# Patient Record
Sex: Male | Born: 1965 | Race: White | Hispanic: No | Marital: Married | Smoking: Never smoker
Health system: Southern US, Community
[De-identification: ages and names within clinical notes are randomized; demographics above are authoritative.]

## PROBLEM LIST (undated history)

## (undated) DIAGNOSIS — J302 Other seasonal allergic rhinitis: Secondary | ICD-10-CM

## (undated) DIAGNOSIS — N200 Calculus of kidney: Secondary | ICD-10-CM

## (undated) DIAGNOSIS — B019 Varicella without complication: Secondary | ICD-10-CM

## (undated) DIAGNOSIS — E7800 Pure hypercholesterolemia, unspecified: Secondary | ICD-10-CM

## (undated) DIAGNOSIS — N390 Urinary tract infection, site not specified: Secondary | ICD-10-CM

## (undated) HISTORY — DX: Pure hypercholesterolemia, unspecified: E78.00

## (undated) HISTORY — DX: Calculus of kidney: N20.0

## (undated) HISTORY — DX: Varicella without complication: B01.9

## (undated) HISTORY — DX: Other seasonal allergic rhinitis: J30.2

## (undated) HISTORY — DX: Urinary tract infection, site not specified: N39.0

---

## 2015-02-25 ENCOUNTER — Ambulatory Visit: Payer: Self-pay | Admitting: Adult Health

## 2015-02-28 ENCOUNTER — Ambulatory Visit (INDEPENDENT_AMBULATORY_CARE_PROVIDER_SITE_OTHER): Payer: BLUE CROSS/BLUE SHIELD | Admitting: Adult Health

## 2015-02-28 ENCOUNTER — Encounter: Payer: Self-pay | Admitting: Adult Health

## 2015-02-28 VITALS — BP 110/72 | Temp 98.3°F | Ht 74.0 in | Wt 234.5 lb

## 2015-02-28 DIAGNOSIS — Z7689 Persons encountering health services in other specified circumstances: Secondary | ICD-10-CM

## 2015-02-28 DIAGNOSIS — Z7189 Other specified counseling: Secondary | ICD-10-CM | POA: Diagnosis not present

## 2015-02-28 DIAGNOSIS — Z23 Encounter for immunization: Secondary | ICD-10-CM | POA: Diagnosis not present

## 2015-02-28 DIAGNOSIS — J209 Acute bronchitis, unspecified: Secondary | ICD-10-CM

## 2015-02-28 MED ORDER — BENZONATATE 200 MG PO CAPS: 200.0000 mg | ORAL_CAPSULE | Freq: Three times a day (TID) | ORAL | Status: DC | PRN

## 2015-02-28 MED ORDER — DOXYCYCLINE HYCLATE 100 MG PO CAPS: 100.0000 mg | ORAL_CAPSULE | Freq: Two times a day (BID) | ORAL | Status: DC

## 2015-02-28 NOTE — Patient Instructions (Addendum)
Follow up with me when it is convenient for you for your complete physical.   Start exercising and continue to eat a healthy diet.   Health Maintenance A healthy lifestyle and preventative care can promote health and wellness.  Maintain regular health, dental, and eye exams.  Eat a healthy diet. Foods like vegetables, fruits, whole grains, low-fat dairy products, and lean protein foods contain the nutrients you need and are low in calories. Decrease your intake of foods high in solid fats, added sugars, and salt. Get information about a proper diet from your health care provider, if necessary.  Regular physical exercise is one of the most important things you can do for your health. Most adults should get at least 150 minutes of moderate-intensity exercise (any activity that increases your heart rate and causes you to sweat) each week. In addition, most adults need muscle-strengthening exercises on 2 or more days a week.   Maintain a healthy weight. The body mass index (BMI) is a screening tool to identify possible weight problems. It provides an estimate of body fat based on height and weight. Your health care provider can find your BMI and can help you achieve or maintain a healthy weight. For males 20 years and older:  A BMI below 18.5 is considered underweight.  A BMI of 18.5 to 24.9 is normal.  A BMI of 25 to 29.9 is considered overweight.  A BMI of 30 and above is considered obese.  Maintain normal blood lipids and cholesterol by exercising and minimizing your intake of saturated fat. Eat a balanced diet with plenty of fruits and vegetables. Blood tests for lipids and cholesterol should begin at age 66 and be repeated every 5 years. If your lipid or cholesterol levels are high, you are over age 24, or you are at high risk for heart disease, you may need your cholesterol levels checked more frequently.Ongoing high lipid and cholesterol levels should be treated with medicines if diet and  exercise are not working.  If you smoke, find out from your health care provider how to quit. If you do not use tobacco, do not start.  Lung cancer screening is recommended for adults aged 55-80 years who are at high risk for developing lung cancer because of a history of smoking. A yearly low-dose CT scan of the lungs is recommended for people who have at least a 30-pack-year history of smoking and are current smokers or have quit within the past 15 years. A pack year of smoking is smoking an average of 1 pack of cigarettes a day for 1 year (for example, a 30-pack-year history of smoking could mean smoking 1 pack a day for 30 years or 2 packs a day for 15 years). Yearly screening should continue until the smoker has stopped smoking for at least 15 years. Yearly screening should be stopped for people who develop a health problem that would prevent them from having lung cancer treatment.  If you choose to drink alcohol, do not have more than 2 drinks per day. One drink is considered to be 12 oz (360 mL) of beer, 5 oz (150 mL) of wine, or 1.5 oz (45 mL) of liquor.  Avoid the use of street drugs. Do not share needles with anyone. Ask for help if you need support or instructions about stopping the use of drugs.  High blood pressure causes heart disease and increases the risk of stroke. Blood pressure should be checked at least every 1-2 years. Ongoing high blood  pressure should be treated with medicines if weight loss and exercise are not effective.  If you are 57-60 years old, ask your health care provider if you should take aspirin to prevent heart disease.  Diabetes screening involves taking a blood sample to check your fasting blood sugar level. This should be done once every 3 years after age 33 if you are at a normal weight and without risk factors for diabetes. Testing should be considered at a younger age or be carried out more frequently if you are overweight and have at least 1 risk factor for  diabetes.  Colorectal cancer can be detected and often prevented. Most routine colorectal cancer screening begins at the age of 69 and continues through age 58. However, your health care provider may recommend screening at an earlier age if you have risk factors for colon cancer. On a yearly basis, your health care provider may provide home test kits to check for hidden blood in the stool. A small camera at the end of a tube may be used to directly examine the colon (sigmoidoscopy or colonoscopy) to detect the earliest forms of colorectal cancer. Talk to your health care provider about this at age 29 when routine screening begins. A direct exam of the colon should be repeated every 5-10 years through age 94, unless early forms of precancerous polyps or small growths are found.  People who are at an increased risk for hepatitis B should be screened for this virus. You are considered at high risk for hepatitis B if:  You were born in a country where hepatitis B occurs often. Talk with your health care provider about which countries are considered high risk.  Your parents were born in a high-risk country and you have not received a shot to protect against hepatitis B (hepatitis B vaccine).  You have HIV or AIDS.  You use needles to inject street drugs.  You live with, or have sex with, someone who has hepatitis B.  You are a man who has sex with other men (MSM).  You get hemodialysis treatment.  You take certain medicines for conditions like cancer, organ transplantation, and autoimmune conditions.  Hepatitis C blood testing is recommended for all people born from [birth year] through 1964 and any individual with known risk factors for hepatitis C.  Healthy men should no longer receive prostate-specific antigen (PSA) blood tests as part of routine cancer screening. Talk to your health care provider about prostate cancer screening.  Testicular cancer screening is not recommended for adolescents or  adult males who have no symptoms. Screening includes self-exam, a health care provider exam, and other screening tests. Consult with your health care provider about any symptoms you have or any concerns you have about testicular cancer.  Practice safe sex. Use condoms and avoid high-risk sexual practices to reduce the spread of sexually transmitted infections (STIs).  You should be screened for STIs, including gonorrhea and chlamydia if:  You are sexually active and are younger than 24 years.  You are older than 24 years, and your health care provider tells you that you are at risk for this type of infection.  Your sexual activity has changed since you were last screened, and you are at an increased risk for chlamydia or gonorrhea. Ask your health care provider if you are at risk.  If you are at risk of being infected with HIV, it is recommended that you take a prescription medicine daily to prevent HIV infection. This is called pre-exposure  prophylaxis (PrEP). You are considered at risk if:  You are a man who has sex with other men (MSM).  You are a heterosexual man who is sexually active with multiple partners.  You take drugs by injection.  You are sexually active with a partner who has HIV.  Talk with your health care provider about whether you are at high risk of being infected with HIV. If you choose to begin PrEP, you should first be tested for HIV. You should then be tested every 3 months for as long as you are taking PrEP.  Use sunscreen. Apply sunscreen liberally and repeatedly throughout the day. You should seek shade when your shadow is shorter than you. Protect yourself by wearing long sleeves, pants, a wide-brimmed hat, and sunglasses year round whenever you are outdoors.  Tell your health care provider of new moles or changes in moles, especially if there is a change in shape or color. Also, tell your health care provider if a mole is larger than the size of a pencil  eraser.  A one-time screening for abdominal aortic aneurysm (AAA) and surgical repair of large AAAs by ultrasound is recommended for men aged 1-75 years who are current or former smokers.  Stay current with your vaccines (immunizations). Document Released: 02/27/2008 Document Revised: 09/05/2013 Document Reviewed: 01/26/2011 [facility] Patient Information 2015 [place], [place]. This information is not intended to replace advice given to you by your health care provider. Make sure you discuss any questions you have with your health care provider.

## 2015-02-28 NOTE — Progress Notes (Signed)
HPI:  Bryan Mccall is here to establish care.  Last PCP and physical: June 2015.   Has the following chronic problems that require follow up and concerns today:  Acute Lower back pain on occasion, usually in the morning, prior to getting out of bed. He also complains of heel pain that is only present when his shoes are off.     Upper respiratory infection  For the last four weeks he has had a productive cough in the morning with yellow mucus. The rest of the day the cough is dry, also endorses nasal drip.     ROS negative for unless reported above: fevers, chills,feeling poorly, unintentional weight loss, hearing or vision loss, chest pain, palpitations, leg claudication, struggling to breath,Not feeling congested in the chest, no orthopenia, no cough,no wheezing, normal appetite, no soft tissue swelling, no hemoptysis, melena, hematochezia, hematuria, falls, loc, si, or thoughts of self harm.  Immunizations:UTD Diet: Eats vegetables, does not eat a lot of carbs. Eats both red and lean meats.  Exercise:Does not exercise, but does work in yard.    Past Medical History  Diagnosis Date  . Chicken pox   . Seasonal allergies   . High cholesterol   . Kidney stone   . UTI (urinary tract infection)     History reviewed. No pertinent past surgical history.  Family History  Problem Relation Age of Onset  . Arthritis Mother   . Arthritis Maternal Grandmother   . Elevated Lipids      both sides of family  . Hypertension Mother   . Hypertension Father   . Heart disease Father   . Emphysema Father   . Diabetes Father   . Hypertension Paternal Grandfather   . Heart disease Paternal Grandfather   . Hypertension Brother     History   Social History  . Marital Status: Married    Spouse Name: N/A  . Number of Children: N/A  . Years of Education: N/A   Social History Main Topics  . Smoking status: Never Smoker   . Smokeless tobacco: Not on file  . Alcohol Use: 0.0 oz/week     0 Standard drinks or equivalent per week     Comment: per pt 1 beer 3 to 4 nights a week with dinner   . Drug Use: No  . Sexual Activity: Not on file   Other Topics Concern  . None   Social History Narrative  . None     Current outpatient prescriptions:  .  ibuprofen (ADVIL,MOTRIN) 800 MG tablet, Take 800 mg by mouth every 8 (eight) hours as needed for moderate pain., Disp: , Rfl:  .  sildenafil (VIAGRA) 25 MG tablet, Take 25 mg by mouth daily as needed for erectile dysfunction., Disp: , Rfl:  .  simvastatin (ZOCOR) 20 MG tablet, Take 20 mg by mouth daily., Disp: , Rfl:   EXAM:  Filed Vitals:   02/28/15 1354  BP: 110/72  Temp: 98.3 F (36.8 C)    Body mass index is 30.1 kg/(m^2).  GENERAL: vitals reviewed and listed above, alert, oriented, appears well hydrated and in no acute distress. Slightly obese   HEENT: atraumatic, conjunttiva clear, no obvious abnormalities on inspection of external nose and ears. TM's visualized  NECK: Neck is soft and supple without masses, no adenopathy or thyromegaly, trachea midline, no JVD. Normal range of motion.   LUNGS: clear to auscultation bilaterally, no wheezes, rales or rhonchi, good air movement  CV: Regular rate and rhythm,  normal S1/S2, no audible murmurs, gallops, or rubs. No carotid bruit and no peripheral edema.   MS: moves all extremities without noticeable abnormality. No edema noted  Abd: soft/nontender/nondistended/normal bowel sounds   Skin: warm and dry, no rash   Extremities: No clubbing, cyanosis, or edema. Capillary refill is WNL. Pulses intact bilaterally in upper and lower extremities.   Neuro: CN II-XII intact, sensation and reflexes normal throughout, 5/5 muscle strength in bilateral upper and lower extremities. Normal finger to nose. Normal rapid alternating movements.  PSYCH: pleasant and cooperative, no obvious depression or anxiety  ASSESSMENT AND PLAN:  Discussed the following assessment and  plan:  1. Encounter to establish care - Follow up in one month for CPE -  Follow up sooner if needed.  Continue to work on diet and exercise. He is 234 lbs and his ideal body weight is 220.  - Back pain is likely due to being overweight.   2. Acute bronchitis, unspecified organism - doxycycline (VIBRAMYCIN) 100 MG capsule; Take 1 capsule (100 mg total) by mouth 2 (two) times daily.  Dispense: 20 capsule; Refill: 0 - benzonatate (TESSALON) 200 MG capsule; Take 1 capsule (200 mg total) by mouth 3 (three) times daily as needed for cough.  Dispense: 20 capsule; Refill: 0 - Follow up if no improvement in 2-3 days.  3. Need for Tdap vaccination  - Tdap vaccine greater than or equal to 7yo IM   No diagnosis found. -We reviewed the PMH, PSH, FH, SH, Meds and Allergies. -We provided refills for any medications we will prescribe as needed. -We addressed current concerns per orders and patient instructions. -We have asked for records for pertinent exams, studies, vaccines and notes from previous providers. -We have advised patient to follow up per instructions below.   -Patient advised to return or notify a provider immediately if symptoms worsen or persist or new concerns arise.  There are no Patient Instructions on file for this visit.   [employer]

## 2015-04-08 ENCOUNTER — Other Ambulatory Visit (INDEPENDENT_AMBULATORY_CARE_PROVIDER_SITE_OTHER): Payer: BLUE CROSS/BLUE SHIELD

## 2015-04-08 DIAGNOSIS — Z0000 Encounter for general adult medical examination without abnormal findings: Secondary | ICD-10-CM

## 2015-04-08 LAB — POCT URINALYSIS DIPSTICK
BILIRUBIN UA: NEGATIVE
Blood, UA: NEGATIVE
GLUCOSE UA: NEGATIVE
Ketones, UA: NEGATIVE
Leukocytes, UA: NEGATIVE
NITRITE UA: NEGATIVE
PROTEIN UA: NEGATIVE
SPEC GRAV UA: 1.025
UROBILINOGEN UA: 0.2
pH, UA: 5.5

## 2015-04-08 LAB — CBC WITH DIFFERENTIAL/PLATELET
BASOS ABS: 0 10*3/uL (ref 0.0–0.1)
Basophils Relative: 0.3 % (ref 0.0–3.0)
Eosinophils Absolute: 0.1 10*3/uL (ref 0.0–0.7)
Eosinophils Relative: 1.4 % (ref 0.0–5.0)
HCT: 45.3 % (ref 39.0–52.0)
Hemoglobin: 15.2 g/dL (ref 13.0–17.0)
LYMPHS PCT: 21.4 % (ref 12.0–46.0)
Lymphs Abs: 1.5 10*3/uL (ref 0.7–4.0)
MCHC: 33.6 g/dL (ref 30.0–36.0)
MCV: 90.4 fl (ref 78.0–100.0)
Monocytes Absolute: 0.4 10*3/uL (ref 0.1–1.0)
Monocytes Relative: 6.1 % (ref 3.0–12.0)
NEUTROS ABS: 4.8 10*3/uL (ref 1.4–7.7)
Neutrophils Relative %: 70.8 % (ref 43.0–77.0)
PLATELETS: 161 10*3/uL (ref 150.0–400.0)
RBC: 5.01 Mil/uL (ref 4.22–5.81)
RDW: 13.5 % (ref 11.5–15.5)
WBC: 6.8 10*3/uL (ref 4.0–10.5)

## 2015-04-08 LAB — LIPID PANEL
CHOL/HDL RATIO: 5
Cholesterol: 196 mg/dL (ref 0–200)
HDL: 43.1 mg/dL (ref >39.00)
LDL Cholesterol: 120 mg/dL — ABNORMAL HIGH (ref 0–99)
NONHDL: 152.9
TRIGLYCERIDES: 164 mg/dL — AB (ref 0.0–149.0)
VLDL: 32.8 mg/dL (ref 0.0–40.0)

## 2015-04-08 LAB — HEPATIC FUNCTION PANEL
ALT: 20 U/L (ref 0–53)
AST: 13 U/L (ref 0–37)
Albumin: 4.5 g/dL (ref 3.5–5.2)
Alkaline Phosphatase: 70 U/L (ref 39–117)
BILIRUBIN TOTAL: 0.5 mg/dL (ref 0.2–1.2)
Bilirubin, Direct: 0.1 mg/dL (ref 0.0–0.3)
TOTAL PROTEIN: 6.8 g/dL (ref 6.0–8.3)

## 2015-04-08 LAB — BASIC METABOLIC PANEL
BUN: 16 mg/dL (ref 6–23)
CHLORIDE: 103 meq/L (ref 96–112)
CO2: 27 meq/L (ref 19–32)
CREATININE: 1.02 mg/dL (ref 0.40–1.50)
Calcium: 9.4 mg/dL (ref 8.4–10.5)
GFR: 82.95 mL/min (ref >60.00)
GLUCOSE: 110 mg/dL — AB (ref 70–99)
Potassium: 4 mEq/L (ref 3.5–5.1)
Sodium: 139 mEq/L (ref 135–145)

## 2015-04-08 LAB — TSH: TSH: 1.35 u[IU]/mL (ref 0.35–4.50)

## 2015-04-15 ENCOUNTER — Encounter: Payer: Self-pay | Admitting: Adult Health

## 2015-04-15 ENCOUNTER — Ambulatory Visit (INDEPENDENT_AMBULATORY_CARE_PROVIDER_SITE_OTHER): Payer: BLUE CROSS/BLUE SHIELD | Admitting: Adult Health

## 2015-04-15 ENCOUNTER — Telehealth: Payer: Self-pay | Admitting: Adult Health

## 2015-04-15 VITALS — BP 120/70 | Temp 99.2°F | Ht 73.0 in | Wt 236.0 lb

## 2015-04-15 DIAGNOSIS — R05 Cough: Secondary | ICD-10-CM

## 2015-04-15 DIAGNOSIS — R059 Cough, unspecified: Secondary | ICD-10-CM

## 2015-04-15 DIAGNOSIS — E785 Hyperlipidemia, unspecified: Secondary | ICD-10-CM | POA: Diagnosis not present

## 2015-04-15 DIAGNOSIS — Z0000 Encounter for general adult medical examination without abnormal findings: Secondary | ICD-10-CM | POA: Diagnosis not present

## 2015-04-15 DIAGNOSIS — R7309 Other abnormal glucose: Secondary | ICD-10-CM

## 2015-04-15 DIAGNOSIS — R739 Hyperglycemia, unspecified: Secondary | ICD-10-CM

## 2015-04-15 LAB — HEMOGLOBIN A1C: HEMOGLOBIN A1C: 6.2 % (ref 4.6–6.5)

## 2015-04-15 MED ORDER — ALBUTEROL SULFATE HFA 108 (90 BASE) MCG/ACT IN AERS: 2.0000 | INHALATION_SPRAY | Freq: Four times a day (QID) | RESPIRATORY_TRACT | Status: DC | PRN

## 2015-04-15 NOTE — Addendum Note (Signed)
Addended by: Azucena Freed on: 04/15/2015 04:27 PM   Modules accepted: Orders

## 2015-04-15 NOTE — Patient Instructions (Signed)
It was great seeing you again!   I will follow up with you regarding your blood work.   Use the Albuteral Inhaler ( 2 puffs) every six hours as needed. You can try Delsym or Mucinex for your cough.   Continue to work on diet and exercise.

## 2015-04-15 NOTE — Progress Notes (Signed)
Pre visit review using our clinic review tool, if applicable. No additional management support is needed unless otherwise documented below in the visit note. 

## 2015-04-15 NOTE — Progress Notes (Addendum)
Subjective:    Patient ID: Bryan Mccall, male    DOB: Apr 17, 1966, 49 y.o.   MRN: [id number]  HPI  49 year old male who presents to the office for his complete physical exam. I last saw him on 02/28/2015 for his establish care visit and for upper respiratory infection. He  has a past medical history of Chicken pox; Seasonal allergies; High cholesterol; Kidney stone; and UTI (urinary tract infection).   He is currently taking  Simvastatin for high cholesterol and tolerates this well.   His blood pressure is well controlled.   His weight it up 2 pounds since his last visit.   He continues to have a dry cough. This is not affecting his sleep and does not have any chest pain or SOB. He endorses that it is getting better on a daily basis.   He does his periodic health maintenance exams such as preventative eye and dental exams.   Has no complaints today.   Review of Systems  Constitutional: Negative.   HENT: Negative.   Eyes: Negative.   Respiratory: Positive for cough. Negative for chest tightness, shortness of breath and wheezing.   Cardiovascular: Negative.   Gastrointestinal: Negative.   Endocrine: Negative.   Genitourinary: Negative.   Musculoskeletal: Negative.   Skin: Negative.   Neurological: Negative.   Hematological: Negative.   Psychiatric/Behavioral: Negative.    Past Medical History  Diagnosis Date  . Chicken pox   . Seasonal allergies   . High cholesterol   . Kidney stone   . UTI (urinary tract infection)     History   Social History  . Marital Status: Married    Spouse Name: N/A  . Number of Children: N/A  . Years of Education: N/A   Occupational History  . Not on file.   Social History Main Topics  . Smoking status: Never Smoker   . Smokeless tobacco: Not on file  . Alcohol Use: 0.0 oz/week    0 Standard drinks or equivalent per week     Comment: per pt 1 beer 3 to 4 nights a week with dinner   . Drug Use: No  . Sexual Activity: Not  on file   Other Topics Concern  . Not on file   Social History Orthoptist for [employer].    Married   49 year old ( [place]) 12 boys       No past surgical history on file.  Family History  Problem Relation Age of Onset  . Arthritis Mother   . Arthritis Maternal Grandmother   . Elevated Lipids      both sides of family  . Hypertension Mother   . Hypertension Father   . Heart disease Father   . Emphysema Father   . Diabetes Father   . Hypertension Paternal Grandfather   . Heart disease Paternal Grandfather   . Hypertension Brother     No Known Allergies  Current Outpatient Prescriptions on File Prior to Visit  Medication Sig Dispense Refill  . ibuprofen (ADVIL,MOTRIN) 800 MG tablet Take 800 mg by mouth every 8 (eight) hours as needed for moderate pain.    . sildenafil (VIAGRA) 25 MG tablet Take 25 mg by mouth daily as needed for erectile dysfunction.     No current facility-administered medications on file prior to visit.    BP 120/70 mmHg  Temp(Src) 99.2 F (37.3 C) (Oral)  Ht  (1.854 m)  Wt 236 lb (107.049 kg)  BMI 31.14 kg/m2       Objective:   Physical Exam  Constitutional: He is oriented to person, place, and time. He appears well-developed and well-nourished. No distress.  HENT:  Head: Normocephalic and atraumatic.  Right Ear: External ear normal.  Left Ear: External ear normal.  Nose: Nose normal.  Mouth/Throat: Oropharynx is clear and moist.  Eyes: Conjunctivae and EOM are normal. Pupils are equal, round, and reactive to light. Right eye exhibits no discharge. Left eye exhibits no discharge. No scleral icterus.  Neck: Normal range of motion. Neck supple. No JVD present. No tracheal deviation present. No thyromegaly present.  Cardiovascular: Normal rate, regular rhythm, normal heart sounds and intact distal pulses.  Exam reveals no gallop and no friction rub.   No murmur heard. Pulmonary/Chest: Effort normal and breath  sounds normal. No respiratory distress. He has no wheezes. He has no rales. He exhibits no tenderness.  Abdominal: Soft. Bowel sounds are normal. He exhibits no distension and no mass. There is no tenderness. There is no rebound and no guarding.  Slightly obese around abdomen  Genitourinary:  Deferred  Musculoskeletal: Normal range of motion. He exhibits no edema or tenderness.  Lymphadenopathy:    He has no cervical adenopathy.  Neurological: He is alert and oriented to person, place, and time. He has normal reflexes. No cranial nerve deficit. Coordination normal.  Skin: Skin is warm and dry. No rash noted. He is not diaphoretic. No erythema. No pallor.  Psychiatric: He has a normal mood and affect. His behavior is normal. Judgment and thought content normal.  Nursing note and vitals reviewed.      Assessment & Plan:  1. Routine general medical examination at a health care facility - Hemoglobin A1c - EKG 12-Lead, NSR, rate 73 - Weight has increased two pounds. Stressed importance of diet and exercise.   2. Elevated blood sugar - Hemoglobin A1c  3. Cough - Likely bronchitis.  - albuterol (PROVENTIL HFA;VENTOLIN HFA) 108 (90 BASE) MCG/ACT inhaler; Inhale 2 puffs into the lungs every 6 (six) hours as needed for wheezing or shortness of breath.  Dispense: 1 Inhaler; Refill: 2 - Follow up if no improvement in one week.  - Can use OTC mucinex or delsym as well.   4. Hyperlipidemia - Stressed importance of diet and exercise. He is already on a statin medication. Consider Zetia in the future.  - Hemoglobin A1c - EKG 12-Lead

## 2015-04-15 NOTE — Telephone Encounter (Signed)
Spoke to patient regarding his A1c result.

## 2015-04-15 NOTE — Addendum Note (Signed)
Addended by: Nancy Fetter on: 04/15/2015 02:54 PM   Modules accepted: Kipp Brood

## 2015-04-22 ENCOUNTER — Telehealth: Payer: Self-pay | Admitting: Adult Health

## 2015-04-22 MED ORDER — SIMVASTATIN 40 MG PO TABS: 40.0000 mg | ORAL_TABLET | Freq: Every day | ORAL | Status: DC

## 2015-04-22 NOTE — Telephone Encounter (Signed)
Rx sent to mail order electronically. Pt states he will call back if he needs a 7 day supply sent to a local pharmacy.

## 2015-04-22 NOTE — Telephone Encounter (Signed)
Pt request refill simvastatin (ZOCOR) 40 MG tablet Pt thought this was called in at his visit. Now has only 7 tabs left [facility] /90 day supply

## 2015-05-21 ENCOUNTER — Encounter: Payer: Self-pay | Admitting: Adult Health

## 2015-05-21 ENCOUNTER — Other Ambulatory Visit: Payer: Self-pay | Admitting: Adult Health

## 2015-05-21 ENCOUNTER — Telehealth: Payer: Self-pay | Admitting: Adult Health

## 2015-05-21 ENCOUNTER — Ambulatory Visit (INDEPENDENT_AMBULATORY_CARE_PROVIDER_SITE_OTHER)
Admission: RE | Admit: 2015-05-21 | Discharge: 2015-05-21 | Disposition: A | Discharge status: Home / Self Care | Payer: BLUE CROSS/BLUE SHIELD | Source: Ambulatory Visit | Attending: Adult Health | Admitting: Adult Health

## 2015-05-21 ENCOUNTER — Ambulatory Visit (INDEPENDENT_AMBULATORY_CARE_PROVIDER_SITE_OTHER): Payer: BLUE CROSS/BLUE SHIELD | Admitting: Adult Health

## 2015-05-21 VITALS — BP 98/70 | HR 99 | Temp 103.0°F | Wt 231.4 lb

## 2015-05-21 DIAGNOSIS — R059 Cough, unspecified: Secondary | ICD-10-CM

## 2015-05-21 DIAGNOSIS — J0100 Acute maxillary sinusitis, unspecified: Secondary | ICD-10-CM | POA: Diagnosis not present

## 2015-05-21 DIAGNOSIS — R509 Fever, unspecified: Secondary | ICD-10-CM | POA: Diagnosis not present

## 2015-05-21 DIAGNOSIS — R05 Cough: Secondary | ICD-10-CM | POA: Diagnosis not present

## 2015-05-21 LAB — POCT RAPID STREP A (OFFICE): RAPID STREP A SCREEN: NEGATIVE

## 2015-05-21 MED ORDER — AMOXICILLIN-POT CLAVULANATE 875-125 MG PO TABS: 1.0000 | ORAL_TABLET | Freq: Two times a day (BID) | ORAL | Status: DC

## 2015-05-21 MED ORDER — HYDROCODONE-HOMATROPINE 5-1.5 MG/5ML PO SYRP
5.0000 mL | ORAL_SOLUTION | Freq: Three times a day (TID) | ORAL | Status: DC | PRN
Start: 2015-05-21 — End: 2015-07-23

## 2015-05-21 NOTE — Progress Notes (Signed)
Subjective:    Patient ID: Bryan Mccall, male    DOB: 1966/01/12, 49 y.o.   MRN: [id number]  HPI  49 year old gentleman who presents to the office for three days fever up to 103, PND, diarrhea, decreased appetite, ear pain, sore throat and sinus pain and pressure. He also endorses cough without production, but that he feels congested in the chest.  He has not had a formed bowel movement in three days. Last BM was this morning and it was diarrhea. Has only tried over the counter Sudafed, which helped slightly. Denies any abdominal pain or nausea, but had one episode of vomiting that was related to cough spell.   He denies any travel or sick contact at home. Does have a sick contact at work that is being treated for PNA.   Review of Systems  Constitutional: Positive for fever, chills, diaphoresis, activity change, appetite change and fatigue. Negative for unexpected weight change.  HENT: Positive for congestion, ear pain, postnasal drip, rhinorrhea, sinus pressure, sore throat and trouble swallowing. Negative for ear discharge.   Eyes: Negative.   Respiratory: Positive for cough and shortness of breath. Negative for choking and chest tightness.   Cardiovascular: Negative.   Gastrointestinal: Negative.   Musculoskeletal: Positive for myalgias.  Skin: Negative.   Neurological: Positive for headaches (temporal). Negative for dizziness, weakness and light-headedness.  Hematological: Positive for adenopathy.  Psychiatric/Behavioral: Positive for sleep disturbance.  All other systems reviewed and are negative.  Past Medical History  Diagnosis Date  . Chicken pox   . Seasonal allergies   . High cholesterol   . Kidney stone   . UTI (urinary tract infection)     Social History   Social History  . Marital Status: Married    Spouse Name: N/A  . Number of Children: N/A  . Years of Education: N/A   Occupational History  . Not on file.   Social History Main Topics  . Smoking  status: Never Smoker   . Smokeless tobacco: Not on file  . Alcohol Use: 0.0 oz/week    0 Standard drinks or equivalent per week     Comment: per pt 1 beer 3 to 4 nights a week with dinner   . Drug Use: No  . Sexual Activity: Not on file   Other Topics Concern  . Not on file   Social History Orthoptist for [employer].    Married   49 year old ( [place]) 12 boys       No past surgical history on file.  Family History  Problem Relation Age of Onset  . Arthritis Mother   . Arthritis Maternal Grandmother   . Elevated Lipids      both sides of family  . Hypertension Mother   . Hypertension Father   . Heart disease Father   . Emphysema Father   . Diabetes Father   . Hypertension Paternal Grandfather   . Heart disease Paternal Grandfather   . Hypertension Brother     No Known Allergies  Current Outpatient Prescriptions on File Prior to Visit  Medication Sig Dispense Refill  . sildenafil (VIAGRA) 25 MG tablet Take 25 mg by mouth daily as needed for erectile dysfunction.    . simvastatin (ZOCOR) 40 MG tablet Take 1 tablet (40 mg total) by mouth daily. 90 tablet 1  . albuterol (PROVENTIL HFA;VENTOLIN HFA) 108 (90 BASE) MCG/ACT inhaler Inhale 2 puffs into the lungs every 6 (six) hours as  needed for wheezing or shortness of breath. (Patient not taking: Reported on 05/21/2015) 1 Inhaler 2  . ibuprofen (ADVIL,MOTRIN) 800 MG tablet Take 800 mg by mouth every 8 (eight) hours as needed for moderate pain.     No current facility-administered medications on file prior to visit.    BP 98/70 mmHg  Pulse 99  Temp(Src) 103 F (39.4 C) (Oral)  Wt 231 lb 6.4 oz (104.962 kg)  SpO2 92%       Objective:   Physical Exam  Constitutional: He is oriented to person, place, and time. He appears well-developed and well-nourished. No distress (appears tired).  HENT:  Head: Normocephalic and atraumatic.  Right Ear: External ear normal.  Left Ear: External ear normal.    Nose: Nose normal.  Mouth/Throat: Oropharyngeal exudate present.  Throat red and slightly swollen  Eyes: Conjunctivae and EOM are normal. Pupils are equal, round, and reactive to light. Right eye exhibits no discharge. Left eye exhibits no discharge.  Neck: Normal range of motion. Neck supple.  Cardiovascular: Normal rate, regular rhythm, normal heart sounds and intact distal pulses.  Exam reveals no gallop and no friction rub.   No murmur heard. Pulmonary/Chest: Effort normal and breath sounds normal. No respiratory distress. He has no wheezes. He has no rales. He exhibits no tenderness.  Abdominal: Soft. Bowel sounds are normal. He exhibits no distension and no mass. There is no tenderness. There is no rebound and no guarding.  Musculoskeletal: Normal range of motion. He exhibits no edema or tenderness.  Lymphadenopathy:    He has cervical adenopathy (Deep cervical and submandibular).  Neurological: He is alert and oriented to person, place, and time.  Skin: Skin is warm. No rash noted. He is diaphoretic. No erythema. No pallor.  Warm to touch and slightly diaphoretic  Psychiatric: He has a normal mood and affect. His behavior is normal. Judgment and thought content normal.  Nursing note and vitals reviewed.      Assessment & Plan:  1. Acute maxillary sinusitis, recurrence not specified - Rapid strep negative - [facility] Criteria was 2. Low possibility of being strep.  - Tylenol every 8 hours for fever - Culture, Blood - CBC with Differential/Platelet - amoxicillin-clavulanate (AUGMENTIN) 875-125 MG per tablet; Take 1 tablet by mouth 2 (two) times daily.  Dispense: 14 tablet; Refill: 0  2. Cough - need to r/o PNA - DG Chest 2 View; Future - Culture, Blood - CBC with Differential/Platelet - HYDROcodone-homatropine (HYCODAN) 5-1.5 MG/5ML syrup; Take 5 mLs by mouth every 8 (eight) hours as needed for cough.  Dispense: 120 mL; Refill: 0

## 2015-05-21 NOTE — Telephone Encounter (Signed)
Spoke to patient on the phone. Advised that chest xray was negative. Since patient forgot to get blood work in office, will hold off on it for now. Advised to follow up if no improvement in 2-3 days.

## 2015-05-21 NOTE — Patient Instructions (Addendum)
I will follow up with you regarding your chest xray and labs.   Start taking the antibiotics today and take them twice a day for 7 days.   Use the cough syrup at night. During the day use Mucinex.   Take tylenol every 8 hours   Stay well hydrated and get some rest. Follow up in 2-3 days if no improvement.

## 2015-05-21 NOTE — Addendum Note (Signed)
Addended by: Azucena Freed on: 05/21/2015 04:30 PM   Modules accepted: Orders

## 2015-05-23 ENCOUNTER — Encounter: Payer: Self-pay | Admitting: Adult Health

## 2015-05-23 LAB — CULTURE, GROUP A STREP: ORGANISM ID, BACTERIA: NORMAL

## 2015-07-23 ENCOUNTER — Ambulatory Visit (INDEPENDENT_AMBULATORY_CARE_PROVIDER_SITE_OTHER): Payer: BLUE CROSS/BLUE SHIELD | Admitting: Adult Health

## 2015-07-23 ENCOUNTER — Ambulatory Visit (INDEPENDENT_AMBULATORY_CARE_PROVIDER_SITE_OTHER)
Admission: RE | Admit: 2015-07-23 | Discharge: 2015-07-23 | Disposition: A | Discharge status: Home / Self Care | Payer: BLUE CROSS/BLUE SHIELD | Source: Ambulatory Visit | Attending: Adult Health | Admitting: Adult Health

## 2015-07-23 ENCOUNTER — Encounter: Payer: Self-pay | Admitting: Adult Health

## 2015-07-23 VITALS — BP 124/80 | Temp 98.5°F | Ht 73.0 in | Wt 232.2 lb

## 2015-07-23 DIAGNOSIS — M79671 Pain in right foot: Secondary | ICD-10-CM

## 2015-07-23 DIAGNOSIS — N509 Disorder of male genital organs, unspecified: Secondary | ICD-10-CM

## 2015-07-23 DIAGNOSIS — N5089 Other specified disorders of the male genital organs: Secondary | ICD-10-CM

## 2015-07-23 NOTE — Progress Notes (Signed)
Subjective:    Patient ID: Bryan Mccall, male    DOB: [birth year], 49 y.o.   MRN: [id number]  HPI  49 year old male who presents to the office for two complaints.   1) Lump on his testicle. He first noticed a lump on his left testicle about a week ago. Throughout the week he has had intermittent pain. Last night when he was laying in bed the pain was as worse as it has been. Pain radiates into the left groin and feels like " I got hit with a ball" Denies any trauma, or discoloration. Pain is rated 4-5 /10.   2) He complains of pain in his right heel, he believes that he may have a bone spur. This has been an ongoing issue with him for a month or more. He has tried new insoles which he endorses helps slightly.   Review of Systems  Constitutional: Negative.   Gastrointestinal: Negative.   Skin: Negative.   Hematological: Negative for adenopathy.  All other systems reviewed and are negative.  Past Medical History  Diagnosis Date  . Chicken pox   . Seasonal allergies   . High cholesterol   . Kidney stone   . UTI (urinary tract infection)     Social History   Social History  . Marital Status: Married    Spouse Name: N/A  . Number of Children: N/A  . Years of Education: N/A   Occupational History  . Not on file.   Social History Main Topics  . Smoking status: Never Smoker   . Smokeless tobacco: Not on file  . Alcohol Use: 0.0 oz/week    0 Standard drinks or equivalent per week     Comment: per pt 1 beer 3 to 4 nights a week with dinner   . Drug Use: No  . Sexual Activity: Not on file   Other Topics Concern  . 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    Married   49 year old ( [place]) 12 boys       No past surgical history on file.  Family History  Problem Relation Age of Onset  . Arthritis Mother   . Arthritis Maternal Grandmother   . Elevated Lipids      both sides of family  . Hypertension Mother   .  Hypertension Father   . Heart disease Father   . Emphysema Father   . Diabetes Father   . Hypertension Paternal Grandfather   . Heart disease Paternal Grandfather   . Hypertension Brother     No Known Allergies  Current Outpatient Prescriptions on File Prior to Visit  Medication Sig Dispense Refill  . albuterol (PROVENTIL HFA;VENTOLIN HFA) 108 (90 BASE) MCG/ACT inhaler Inhale 2 puffs into the lungs every 6 (six) hours as needed for wheezing or shortness of breath. 1 Inhaler 2  . ibuprofen (ADVIL,MOTRIN) 800 MG tablet Take 800 mg by mouth every 8 (eight) hours as needed for moderate pain.    . sildenafil (VIAGRA) 25 MG tablet Take 25 mg by mouth daily as needed for erectile dysfunction.    . simvastatin (ZOCOR) 40 MG tablet Take 1 tablet (40 mg total) by mouth daily. 90 tablet 1   No current facility-administered medications on file prior to visit.    BP 124/80 mmHg  Temp(Src) 98.5 F (36.9 C) (Oral)  Ht 6\' 1"  (1.854 m)  Wt 232 lb 3.2 oz (105.325 kg)  BMI  30.64 kg/m2       Objective:   Physical Exam  Constitutional: He is oriented to person, place, and time. He appears well-developed and well-nourished. No distress.  Abdominal: Soft. Bowel sounds are normal. He exhibits no distension and no mass. There is no tenderness. There is no rebound and no guarding.  Genitourinary: Penis normal.  Small cystic feeling mass on left testicle.   Musculoskeletal: Normal range of motion. He exhibits no edema or tenderness.  Neurological: He is alert and oriented to person, place, and time.  Skin: Skin is warm and dry. He is not diaphoretic.  Psychiatric: He has a normal mood and affect. His behavior is normal. Judgment and thought content normal.  Nursing note and vitals reviewed.     Assessment & Plan:  1. Mass of testicle - US Scrotum; Future - Ibuprofen  as needd 2. Heel pain, right - DG Foot Complete Right; Future - Ibuprofen  as needed - Consider referral to Podiatry  or Ortho

## 2015-07-23 NOTE — Patient Instructions (Addendum)
It was great seeing again!  Someone will call you to schedule the ultrasound of the scrotum   I will call you about the x ray of the right foot.   If I do not see you, enjoy your holiday season!

## 2015-07-24 ENCOUNTER — Encounter: Payer: Self-pay | Admitting: Adult Health

## 2015-07-26 ENCOUNTER — Ambulatory Visit
Admission: RE | Admit: 2015-07-26 | Discharge: 2015-07-26 | Disposition: A | Discharge status: Home / Self Care | Payer: BLUE CROSS/BLUE SHIELD | Source: Ambulatory Visit | Attending: Adult Health | Admitting: Adult Health

## 2015-07-26 ENCOUNTER — Other Ambulatory Visit: Payer: Self-pay | Admitting: Adult Health

## 2015-07-26 ENCOUNTER — Telehealth: Payer: Self-pay | Admitting: Adult Health

## 2015-07-26 DIAGNOSIS — N5089 Other specified disorders of the male genital organs: Secondary | ICD-10-CM

## 2015-07-26 DIAGNOSIS — M779 Enthesopathy, unspecified: Secondary | ICD-10-CM

## 2015-07-26 DIAGNOSIS — N433 Hydrocele, unspecified: Secondary | ICD-10-CM

## 2015-07-26 NOTE — Telephone Encounter (Signed)
Pt would like a call back about his xray results

## 2015-08-12 ENCOUNTER — Ambulatory Visit (INDEPENDENT_AMBULATORY_CARE_PROVIDER_SITE_OTHER): Payer: BLUE CROSS/BLUE SHIELD

## 2015-08-12 ENCOUNTER — Ambulatory Visit (INDEPENDENT_AMBULATORY_CARE_PROVIDER_SITE_OTHER): Payer: BLUE CROSS/BLUE SHIELD | Admitting: Podiatry

## 2015-08-12 ENCOUNTER — Encounter: Payer: Self-pay | Admitting: Podiatry

## 2015-08-12 VITALS — BP 127/78 | HR 67 | Resp 16 | Ht 74.0 in | Wt 230.0 lb

## 2015-08-12 DIAGNOSIS — M722 Plantar fascial fibromatosis: Secondary | ICD-10-CM | POA: Diagnosis not present

## 2015-08-12 DIAGNOSIS — M79673 Pain in unspecified foot: Secondary | ICD-10-CM

## 2015-08-12 MED ORDER — TRIAMCINOLONE ACETONIDE 10 MG/ML IJ SUSP
10.0000 mg | Freq: Once | INTRAMUSCULAR | Status: AC
  Administered 2015-08-12: 10 mg

## 2015-08-12 MED ORDER — MELOXICAM 15 MG PO TABS: 15.0000 mg | ORAL_TABLET | Freq: Every day | ORAL | Status: DC

## 2015-08-12 NOTE — Progress Notes (Signed)
Subjective:     Patient ID: Bryan Mccall, male   DOB: December 08, 1965, 49 y.o.   MRN: [id number]  HPI patient states I've had a lot of pain in the bottom of my right heel and it's been present for around 3 months. Worse when I get up in the morning or after periods of sitting but now hurts at all times   Review of Systems  All other systems reviewed and are negative.      Objective:   Physical Exam  Constitutional: He is oriented to person, place, and time.  Cardiovascular: Intact distal pulses.   Musculoskeletal: Normal range of motion.  Neurological: He is oriented to person, place, and time.  Skin: Skin is warm.  Nursing note and vitals reviewed.  neurovascular status intact muscle strength adequate range of motion within normal limits with patient noted to have exquisite discomfort right plantar fashion at the insertion into the calcaneus. Moderate depression of the arch is noted with fluid buildup around the insertional point and I noted good digital perfusion and patient is well oriented 3     Assessment:     Acute plantar fasciitis right with inflammation fluid buildup and mechanical dysfunction    Plan:     H&P and x-rays reviewed with patient. Today I injected the plantar fascia right 3 mg Kenalog 5 mg Xylocaine and applied fascial brace with instructions on usage and placed on molded 15 mg daily and discussed long-term orthotic treatment. Reappoint to recheck

## 2015-08-12 NOTE — Progress Notes (Signed)
   Subjective:    Patient ID: Bryan Mccall, male    DOB: 02-Feb-1966, 49 y.o.   MRN: [id number]  HPI Patient presents with foot pain in their right foot, heel. Pt stated, "His dr. told hiim he had a heel spur". This has been going on for the past 3 months.   Review of Systems  All other systems reviewed and are negative.      Objective:   Physical Exam        Assessment & Plan:

## 2015-08-12 NOTE — Patient Instructions (Signed)

## 2015-08-19 ENCOUNTER — Ambulatory Visit (INDEPENDENT_AMBULATORY_CARE_PROVIDER_SITE_OTHER): Payer: BLUE CROSS/BLUE SHIELD | Admitting: Podiatry

## 2015-08-19 ENCOUNTER — Encounter: Payer: Self-pay | Admitting: Podiatry

## 2015-08-19 VITALS — BP 125/73 | HR 58 | Resp 16

## 2015-08-19 DIAGNOSIS — M722 Plantar fascial fibromatosis: Secondary | ICD-10-CM

## 2015-08-20 NOTE — Progress Notes (Signed)
Subjective:     Patient ID: Bryan Mccall, male   DOB: 12-17-1965, 49 y.o.   MRN: [id number]  HPI patient states that my heel is improving but I'm getting pain in my arch and pain in general in both feet with flatfoot deformity   Review of Systems     Objective:   Physical Exam Neurovascular status intact muscle strength adequate with discomfort in the plantar arch bilateral with moderate depression of the arch noted and mild equinus condition    Assessment:     Fasciitis-like symptomatology with structural abnormalities    Plan:     Explained the importance of supportive shoe gear and stretching and scanned for custom orthotics to reduce plantar stress on his feet

## 2015-09-17 ENCOUNTER — Ambulatory Visit: Payer: BLUE CROSS/BLUE SHIELD | Admitting: *Deleted

## 2015-09-17 DIAGNOSIS — M722 Plantar fascial fibromatosis: Secondary | ICD-10-CM

## 2015-09-17 NOTE — Progress Notes (Signed)
Patient ID: Bryan Mccall, male   DOB: April 19, 1966, 50 y.o.   MRN: [id number] Patient presents for orthotic pick up.  Verbal and written break in and wear instructions given.  Patient will follow up in 4 weeks if symptoms worsen or fail to improve.

## 2015-09-17 NOTE — Patient Instructions (Signed)

## 2015-10-29 ENCOUNTER — Telehealth: Payer: Self-pay | Admitting: Adult Health

## 2015-10-29 MED ORDER — SIMVASTATIN 40 MG PO TABS: 40.0000 mg | ORAL_TABLET | Freq: Every day | ORAL | Status: DC

## 2015-10-29 NOTE — Telephone Encounter (Signed)
Patient would like his Simvastatin  refilled at his mail order pharmacy for a 90day supply

## 2015-10-29 NOTE — Telephone Encounter (Signed)
Rx sent to pharmacy   

## 2015-11-12 ENCOUNTER — Other Ambulatory Visit: Payer: Self-pay | Admitting: Podiatry

## 2015-11-12 NOTE — Telephone Encounter (Signed)
Pt needs an appt prior to refills if having problems.

## 2015-12-10 ENCOUNTER — Other Ambulatory Visit: Payer: Self-pay | Admitting: Podiatry

## 2016-01-08 ENCOUNTER — Other Ambulatory Visit: Payer: Self-pay | Admitting: Podiatry

## 2016-02-04 ENCOUNTER — Telehealth: Payer: Self-pay | Admitting: *Deleted

## 2016-02-04 NOTE — Telephone Encounter (Addendum)
Received fax request for Meloxicam refill. Pt needs an appt to be evaluated for continued pain, last seen in office 2016.  I encouraged pt to make an appt to be evaluated if he was still having pain after 5 months, pt agreed and asked to be called again in 15 minutes.

## 2016-02-06 ENCOUNTER — Encounter: Payer: Self-pay | Admitting: Podiatry

## 2016-02-06 ENCOUNTER — Ambulatory Visit (INDEPENDENT_AMBULATORY_CARE_PROVIDER_SITE_OTHER): Payer: BLUE CROSS/BLUE SHIELD | Admitting: Podiatry

## 2016-02-06 VITALS — BP 117/72 | HR 64 | Resp 16

## 2016-02-06 DIAGNOSIS — M722 Plantar fascial fibromatosis: Secondary | ICD-10-CM | POA: Diagnosis not present

## 2016-02-06 MED ORDER — TRIAMCINOLONE ACETONIDE 10 MG/ML IJ SUSP
10.0000 mg | Freq: Once | INTRAMUSCULAR | Status: AC
  Administered 2016-02-06: 10 mg

## 2016-02-06 MED ORDER — PREDNISONE 10 MG PO TABS: ORAL_TABLET | ORAL | Status: DC

## 2016-02-06 NOTE — Progress Notes (Signed)
Subjective:     Patient ID: Bryan Mccall, male   DOB: 12/05/1965, 50 y.o.   MRN: [id number]  HPI patient presents stating he still having a lot of pain in the bottom of his right heel and when he tries to exercise or be active it really can bother him   Review of Systems     Objective:   Physical Exam Neurovascular status intact muscle strength adequate discomfort in the plantar aspect right heel that still is quite intense when palpated    Assessment:     Continued acute plantar fasciitis right    Plan:     Advised on physical therapy anti-inflammatory and today I reinjected the plantar fascia 3 Milligan Kenalog 5 mg Xylocaine and applied a night splint with instructions on usage and placed on a 12 day Sterapred DS Dosepak and we'll reevaluate again in 2 weeks and may require more aggressive treatment depending on response

## 2016-02-20 ENCOUNTER — Ambulatory Visit (INDEPENDENT_AMBULATORY_CARE_PROVIDER_SITE_OTHER): Payer: BLUE CROSS/BLUE SHIELD | Admitting: Podiatry

## 2016-02-20 ENCOUNTER — Encounter: Payer: Self-pay | Admitting: Podiatry

## 2016-02-20 DIAGNOSIS — M722 Plantar fascial fibromatosis: Secondary | ICD-10-CM

## 2016-02-20 NOTE — Patient Instructions (Signed)
Pre-Operative Instructions  Congratulations, you have decided to take an important step to improving your quality of life.  You can be assured that the doctors of [facility] will be with you every step of the way.  1. Plan to be at the surgery center/hospital at least 1 (one) hour prior to your scheduled time unless otherwise directed by the surgical center/hospital staff.  You must have a responsible adult accompany you, remain during the surgery and drive you home.  Make sure you have directions to the surgical center/hospital and know how to get there on time. 2. For hospital based surgery you will need to obtain a history and physical form from your family physician within 1 month prior to the date of surgery- we will give you a form for you primary physician.  3. We make every effort to accommodate the date you request for surgery.  There are however, times where surgery dates or times have to be moved.  We will contact you as soon as possible if a change in schedule is required.   4. No Aspirin/Ibuprofen for one week before surgery.  If you are on aspirin, any non-steroidal anti-inflammatory medications (Mobic, Aleve, Ibuprofen) you should stop taking it 7 days prior to your surgery.  You make take Tylenol  For pain prior to surgery.  5. Medications- If you are taking daily heart and blood pressure medications, seizure, reflux, allergy, asthma, anxiety, pain or diabetes medications, make sure the surgery center/hospital is aware before the day of surgery so they may notify you which medications to take or avoid the day of surgery. 6. No food or drink after midnight the night before surgery unless directed otherwise by surgical center/hospital staff. 7. No alcoholic beverages 24 hours prior to surgery.  No smoking 24 hours prior to or 24 hours after surgery. 8. Wear loose pants or shorts- loose enough to fit over bandages, boots, and casts. 9. No slip on shoes, sneakers are best. 10. Bring  your boot with you to the surgery center/hospital.  Also bring crutches or a walker if your physician has prescribed it for you.  If you do not have this equipment, it will be provided for you after surgery. 11. If you have not been contracted by the surgery center/hospital by the day before your surgery, call to confirm the date and time of your surgery. 12. Leave-time from work may vary depending on the type of surgery you have.  Appropriate arrangements should be made prior to surgery with your employer. 13. Prescriptions will be provided immediately following surgery by your doctor.  Have these filled as soon as possible after surgery and take the medication as directed. 14. Remove nail polish on the operative foot. 15. Wash the night before surgery.  The night before surgery wash the foot and leg well with the antibacterial soap provided and water paying special attention to beneath the toenails and in between the toes.  Rinse thoroughly with water and dry well with a towel.  Perform this wash unless told not to do so by your physician.  Enclosed: 1 Ice pack (please put in freezer the night before surgery)   1 Hibiclens skin cleaner   Pre-op Instructions  If you have any questions regarding the instructions, do not hesitate to call our office.  [place]: [address] [place], [place] [address] [phone]  [place]: [address] [place], [place] [address] [phone]  [place]: [address]  [place], [place] [address] [phone]   Dr.   Norman Regal DPM, Dr. Matthew Wagoner DPM, Dr. M. Todd Hyatt DPM, Dr. Titorya Stover DPM 

## 2016-02-21 NOTE — Progress Notes (Signed)
Subjective:     Patient ID: Bryan Mccall, male   DOB: 1965/10/19, 50 y.o.   MRN: [id number]  HPI patient states this right heel has still been killing me and I'm not able to do any of the activities that I want to do   Review of Systems     Objective:   Physical Exam Neurovascular status intact muscle strength adequate range of motion within normal limits with patient found to have exquisite discomfort in the right plantar fascia at the insertional point of the tendon into the calcaneus    Assessment:     Acute plantar fasciitis right with inflammation and fluid around the medial band    Plan:     Reviewed this case at great length and due to long-standing nature and the nature of his job he has opted for correction. I did explain endoscopic surgery and reviewed the alternative treatments complications as listed on the consent form and the fact there can be long-term arch pain lateral foot pain. Patient wants surgery understanding risk and today I went ahead and I allowed patient to review the consent form and after appropriate review he signed consent form after extensive discussion. Patient is scheduled for outpatient surgery and was dispensed air fracture walker with all instructions on usage preoperative instructions and encouraged to call with any questions prior to procedure

## 2016-03-04 ENCOUNTER — Telehealth: Payer: Self-pay | Admitting: *Deleted

## 2016-03-04 NOTE — Telephone Encounter (Signed)
"  I'm scheduled to have surgery next Tuesday.  I was told if I didn't get a phone call 7 days out that I should be calling.  I haven't gotten a call.  I'd appreciate it if you'd give me some assistance.  Give me a shout let me know what time my surgery is going to be etc. So I can plan accordingly."

## 2016-03-04 NOTE — Telephone Encounter (Signed)
I'm returning your call.  Someone from the surgical center normally calls you a day or two prior to surgery date with the arrival time.  "[place] you [place] if it will be done in morning, mid-day or evening time?  I'm trying to make plans."  It will be in the morning.  "Okay, thanks for calling me back."

## 2016-03-10 ENCOUNTER — Encounter: Payer: Self-pay | Admitting: Podiatry

## 2016-03-10 DIAGNOSIS — M722 Plantar fascial fibromatosis: Secondary | ICD-10-CM | POA: Diagnosis not present

## 2016-03-11 ENCOUNTER — Telehealth: Payer: Self-pay | Admitting: *Deleted

## 2016-03-11 NOTE — Telephone Encounter (Signed)
Post op courtesy call-Pt states he is doing very good, asked if he could sleep in the nightsplint instead of the boot.  I told him that was a great idea, but to walk in the boot, because it wouldn't slide on the floor as readily. I instructed pt in the basics of the post op instruction sheet, not to be up on or dangling the foot more than 15 mins/hour, leave the surgical dressing in place until the 1st POV, remain in the boot to walk, and to call with concerns.

## 2016-03-20 ENCOUNTER — Ambulatory Visit (INDEPENDENT_AMBULATORY_CARE_PROVIDER_SITE_OTHER): Payer: BLUE CROSS/BLUE SHIELD | Admitting: Podiatry

## 2016-03-20 ENCOUNTER — Encounter: Payer: Self-pay | Admitting: Podiatry

## 2016-03-20 VITALS — Temp 99.1°F

## 2016-03-20 DIAGNOSIS — M722 Plantar fascial fibromatosis: Secondary | ICD-10-CM | POA: Diagnosis not present

## 2016-03-22 NOTE — Progress Notes (Signed)
Subjective:     Patient ID: Bryan Mccall, male   DOB: [birth year], 50 y.o.   MRN: [id number]  HPI patient states she's doing great with his right heel with minimal discomfort or swelling   Review of Systems     Objective:   Physical Exam Neurovascular status intact muscle strength adequate with incision sites intact with stitches well coapted right medial and lateral heel    Assessment:     Doing well from endoscopic surgery right    Plan:     Went ahead and remove stitches and discussed continued immobilization continued compression elevation and reappoint as needed in the next 4-6 weeks

## 2016-04-03 ENCOUNTER — Encounter: Payer: Self-pay | Admitting: Podiatry

## 2016-06-11 NOTE — Progress Notes (Signed)
DOS 06.27.2017 Endoscopic Release Med Band Right Heel

## 2016-07-13 ENCOUNTER — Other Ambulatory Visit: Payer: Self-pay | Admitting: Adult Health

## 2016-08-03 ENCOUNTER — Other Ambulatory Visit (INDEPENDENT_AMBULATORY_CARE_PROVIDER_SITE_OTHER): Payer: BLUE CROSS/BLUE SHIELD

## 2016-08-03 DIAGNOSIS — Z0000 Encounter for general adult medical examination without abnormal findings: Secondary | ICD-10-CM

## 2016-08-03 LAB — POC URINALSYSI DIPSTICK (AUTOMATED)
BILIRUBIN UA: NEGATIVE
Glucose, UA: NEGATIVE
KETONES UA: NEGATIVE
Leukocytes, UA: NEGATIVE
NITRITE UA: NEGATIVE
PH UA: 5.5
RBC UA: NEGATIVE
Spec Grav, UA: 1.025
Urobilinogen, UA: 0.2

## 2016-08-03 LAB — CBC WITH DIFFERENTIAL/PLATELET
Basophils Absolute: 0 10*3/uL (ref 0.0–0.1)
Basophils Relative: 0.6 % (ref 0.0–3.0)
EOS PCT: 1.3 % (ref 0.0–5.0)
Eosinophils Absolute: 0.1 10*3/uL (ref 0.0–0.7)
HCT: 47.2 % (ref 39.0–52.0)
Hemoglobin: 15.9 g/dL (ref 13.0–17.0)
LYMPHS ABS: 1.7 10*3/uL (ref 0.7–4.0)
Lymphocytes Relative: 26.9 % (ref 12.0–46.0)
MCHC: 33.7 g/dL (ref 30.0–36.0)
MCV: 90.5 fl (ref 78.0–100.0)
MONOS PCT: 7.5 % (ref 3.0–12.0)
Monocytes Absolute: 0.5 10*3/uL (ref 0.1–1.0)
NEUTROS ABS: 4 10*3/uL (ref 1.4–7.7)
NEUTROS PCT: 63.7 % (ref 43.0–77.0)
PLATELETS: 174 10*3/uL (ref 150.0–400.0)
RBC: 5.22 Mil/uL (ref 4.22–5.81)
RDW: 13.4 % (ref 11.5–15.5)
WBC: 6.3 10*3/uL (ref 4.0–10.5)

## 2016-08-03 LAB — HEPATIC FUNCTION PANEL
ALT: 28 U/L (ref 0–53)
AST: 16 U/L (ref 0–37)
Albumin: 4.8 g/dL (ref 3.5–5.2)
Alkaline Phosphatase: 66 U/L (ref 39–117)
BILIRUBIN DIRECT: 0.1 mg/dL (ref 0.0–0.3)
BILIRUBIN TOTAL: 0.7 mg/dL (ref 0.2–1.2)
TOTAL PROTEIN: 6.9 g/dL (ref 6.0–8.3)

## 2016-08-03 LAB — LIPID PANEL
Cholesterol: 201 mg/dL — ABNORMAL HIGH (ref 0–200)
HDL: 54.1 mg/dL (ref >39.00)
LDL CALC: 124 mg/dL — AB (ref 0–99)
NONHDL: 147.19
Total CHOL/HDL Ratio: 4
Triglycerides: 114 mg/dL (ref 0.0–149.0)
VLDL: 22.8 mg/dL (ref 0.0–40.0)

## 2016-08-03 LAB — BASIC METABOLIC PANEL
BUN: 18 mg/dL (ref 6–23)
CALCIUM: 9.8 mg/dL (ref 8.4–10.5)
CO2: 30 mEq/L (ref 19–32)
CREATININE: 1.07 mg/dL (ref 0.40–1.50)
Chloride: 101 mEq/L (ref 96–112)
GFR: 78.06 mL/min (ref >60.00)
Glucose, Bld: 110 mg/dL — ABNORMAL HIGH (ref 70–99)
Potassium: 4.1 mEq/L (ref 3.5–5.1)
Sodium: 140 mEq/L (ref 135–145)

## 2016-08-03 LAB — TSH: TSH: 1.44 u[IU]/mL (ref 0.35–4.50)

## 2016-08-14 ENCOUNTER — Encounter: Payer: Self-pay | Admitting: Adult Health

## 2016-08-14 ENCOUNTER — Ambulatory Visit (INDEPENDENT_AMBULATORY_CARE_PROVIDER_SITE_OTHER): Payer: BLUE CROSS/BLUE SHIELD | Admitting: Adult Health

## 2016-08-14 VITALS — BP 128/72 | Temp 98.6°F | Ht 74.0 in | Wt 242.3 lb

## 2016-08-14 DIAGNOSIS — E785 Hyperlipidemia, unspecified: Secondary | ICD-10-CM

## 2016-08-14 DIAGNOSIS — M25512 Pain in left shoulder: Secondary | ICD-10-CM

## 2016-08-14 DIAGNOSIS — Z0000 Encounter for general adult medical examination without abnormal findings: Secondary | ICD-10-CM | POA: Diagnosis not present

## 2016-08-14 DIAGNOSIS — M545 Low back pain, unspecified: Secondary | ICD-10-CM

## 2016-08-14 DIAGNOSIS — R7309 Other abnormal glucose: Secondary | ICD-10-CM

## 2016-08-14 LAB — POCT GLYCOSYLATED HEMOGLOBIN (HGB A1C): HEMOGLOBIN A1C: 6.5

## 2016-08-14 MED ORDER — METHYLPREDNISOLONE 4 MG PO TBPK: ORAL_TABLET | ORAL | 0 refills | Status: DC

## 2016-08-14 MED ORDER — SIMVASTATIN 40 MG PO TABS: 40.0000 mg | ORAL_TABLET | Freq: Every day | ORAL | 3 refills | Status: DC

## 2016-08-14 NOTE — Patient Instructions (Signed)
It was great seeing you today.   I have sent in a prescription for a medrol dose pack. Take this as directed.   Someone will follow up with you about your appointment with sports medicine.   Your A1c has increased from 6.2 to 6.5   Please follow up with me in 3 months for follow up

## 2016-08-14 NOTE — Progress Notes (Signed)
Subjective:    Patient ID: Bryan Mccall, male    DOB: 12-19-1965, 50 y.o.   MRN: [id number]  HPI  Patient presents for yearly preventative medicine examination. He is a pleasant 50 year old male who  has a past medical history of Chicken pox; High cholesterol; Kidney stone; Seasonal allergies; and UTI (urinary tract infection).  All immunizations and health maintenance protocols were reviewed with the patient and needed orders were placed. He will get his flu shot today   Medication reconciliation,  past medical history, social history, problem list and allergies were reviewed in detail with the patient  Goals were established with regard to weight loss, exercise, and  diet in compliance with medications. He is traveling a lot and is having to eat out a lot. He and his wife are going to join a gym.   End of life planning was discussed.  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 He has recovered well from this and feels as though he is ready to start exercising again.   He does have two acute complaints.   1. Right lower back pain. He reports that a month a half ago he bent over to pick a glove off the ground. When he bent over he felt is back " freeze up." He has been using a TENS unit which he endorses has made a difference but every day he still has discomfort in his right lower back.   2. Left shoulder pain. He reports that about the same time he injured his back he started to get pain in his left shoulder. He does not remember any trauma to the area. Per patient the pain is worse when he rolls over in bed onto his shoulder or when he picks up a grocery bag. He is able to lift his shoulder above his head but when he does this he has discomfort.   Review of Systems  Constitutional: Negative.   HENT: Negative.   Eyes: Negative.   Respiratory: Negative.   Cardiovascular: Negative.   Gastrointestinal: Negative.   Endocrine: Negative.     Genitourinary: Negative.   Musculoskeletal: Positive for arthralgias, back pain and myalgias. Negative for joint swelling.  Skin: Negative.   Allergic/Immunologic: Negative.   Neurological: Negative.   Hematological: Negative.   Psychiatric/Behavioral: Negative.   All other systems reviewed and are negative.  Past Medical History:  Diagnosis Date  . Chicken pox   . High cholesterol   . Kidney stone   . Seasonal allergies   . UTI (urinary tract infection)     Social History   Social History  . Marital status: Married    Spouse name: N/A  . Number of children: N/A  . Years of education: N/A   Occupational History  . Not on file.   Social History Main Topics  . Smoking status: Never Smoker  . Smokeless tobacco: Not on file  . Alcohol use 0.0 oz/week     Comment: per pt 1 beer 3 to 4 nights a week with dinner   . Drug use: No  . Sexual activity: Not on file   Other Topics Concern  . 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    Married   50 year old ( [place]) 12 boys       No past surgical history on file.  Family History  Problem Relation Age of Onset  . Arthritis Mother   .  Arthritis Maternal Grandmother   . Elevated Lipids      both sides of family  . Hypertension Mother   . Hypertension Father   . Heart disease Father   . Emphysema Father   . Diabetes Father   . Hypertension Paternal Grandfather   . Heart disease Paternal Grandfather   . Hypertension Brother     No Known Allergies  Current Outpatient Prescriptions on File Prior to Visit  Medication Sig Dispense Refill  . albuterol (PROVENTIL HFA;VENTOLIN HFA) 108 (90 BASE) MCG/ACT inhaler Inhale 2 puffs into the lungs every 6 (six) hours as needed for wheezing or shortness of breath. 1 Inhaler 2  . HYDROcodone-acetaminophen (NORCO) 10-325 MG tablet Take 1 tablet by mouth every 4 (four) hours as needed.    Marland Kitchen. ibuprofen (ADVIL,MOTRIN) 800 MG tablet Take 800 mg  by mouth every 8 (eight) hours as needed for moderate pain.    . meloxicam (MOBIC) 15 MG tablet TAKE 1 TABLET (15 MG TOTAL) BY MOUTH DAILY. 30 tablet 0  . predniSONE (DELTASONE) 10 MG tablet 12 day tapering dose 48 tablet 0  . sildenafil (VIAGRA) 25 MG tablet Take 25 mg by mouth daily as needed for erectile dysfunction.    . simvastatin (ZOCOR) 40 MG tablet TAKE 1 TABLET DAILY 90 tablet 0   No current facility-administered medications on file prior to visit.     There were no vitals taken for this visit.      Objective:   Physical Exam  Constitutional: He is oriented to person, place, and time. He appears well-developed and well-nourished. No distress.  HENT:  Head: Normocephalic and atraumatic.  Right Ear: External ear normal.  Left Ear: External ear normal.  Nose: Nose normal.  Mouth/Throat: Oropharynx is clear and moist. No oropharyngeal exudate.  Eyes: Conjunctivae are normal. Pupils are equal, round, and reactive to light. Right eye exhibits no discharge. Left eye exhibits no discharge.  Neck: Trachea normal and normal range of motion. Neck supple. Carotid bruit is not present. No tracheal deviation present. No thyroid mass and no thyromegaly present.  Cardiovascular: Normal rate, regular rhythm, normal heart sounds and intact distal pulses.  Exam reveals no gallop and no friction rub.   No murmur heard. Pulmonary/Chest: Effort normal and breath sounds normal. No respiratory distress. He has no wheezes. He has no rales. He exhibits no tenderness.  Abdominal: Soft. Bowel sounds are normal. He exhibits no distension and no mass. There is no tenderness. There is no rebound and no guarding.  Genitourinary: Rectum normal. Prostate is not enlarged and not tender.  Musculoskeletal: Normal range of motion. He exhibits tenderness. He exhibits no edema or deformity.  Tenderness around left scapula, acromion and deltoid. Good grip strength and full ROM   Lymphadenopathy:    He has no  cervical adenopathy.  Neurological: He is alert and oriented to person, place, and time. He has normal reflexes. He displays normal reflexes. No cranial nerve deficit. He exhibits normal muscle tone. Coordination normal.  Skin: Skin is warm and dry. No rash noted. He is not diaphoretic. No erythema. No pallor.  Psychiatric: He has a normal mood and affect. His behavior is normal. Judgment and thought content normal.  Nursing note and vitals reviewed.     Assessment & Plan:  1. Routine general medical examination at a health care facility - Reviewed labs in detail with patient.  - he needs to start working out and eating healthier - Follow up in one year or sooner  if needed  2. Hyperlipidemia, unspecified hyperlipidemia type  - simvastatin (ZOCOR) 40 MG tablet; Take 1 tablet (40 mg total) by mouth daily.  Dispense: 90 tablet; Refill: 3  3. Acute right-sided low back pain without sciatica - appears as muscular  - methylPREDNISolone (MEDROL DOSEPAK) 4 MG TBPK tablet; Take as directed  Dispense: 21 tablet; Refill: 0  4. Acute pain of left shoulder - to r/o rotator cuff injury  - Ambulatory referral to Sports Medicine  5. Elevated glucose level - POC HgB A1c- 6.5  - Follow up in three months - He needs to start exercising and eating healthy   Shirline Frees, NP

## 2016-08-19 ENCOUNTER — Encounter: Payer: Self-pay | Admitting: Adult Health

## 2016-08-26 ENCOUNTER — Ambulatory Visit: Payer: BLUE CROSS/BLUE SHIELD | Admitting: Student

## 2016-09-01 ENCOUNTER — Ambulatory Visit: Payer: Self-pay

## 2016-09-01 ENCOUNTER — Ambulatory Visit (INDEPENDENT_AMBULATORY_CARE_PROVIDER_SITE_OTHER): Payer: BLUE CROSS/BLUE SHIELD | Admitting: Sports Medicine

## 2016-09-01 ENCOUNTER — Encounter: Payer: Self-pay | Admitting: Sports Medicine

## 2016-09-01 VITALS — BP 130/80 | Ht 74.0 in | Wt 238.0 lb

## 2016-09-01 DIAGNOSIS — M25512 Pain in left shoulder: Secondary | ICD-10-CM

## 2016-09-01 DIAGNOSIS — M7542 Impingement syndrome of left shoulder: Secondary | ICD-10-CM

## 2016-09-01 MED ORDER — NAPROXEN 500 MG PO TABS: ORAL_TABLET | ORAL | 2 refills | Status: AC

## 2016-09-01 NOTE — Progress Notes (Signed)
   Subjective:    Patient ID: Bryan Mccall, male    DOB: July 25, 1966, 50 y.o.   MRN: [id number]  HPI chief complaint: Left shoulder pain  Very pleasant 50 year old male comes in today complaining of 3 months of left shoulder pain. He denies any known trauma. He describes a gradual onset of pain that has worsened over the past 3 months. His pain is diffuse throughout the shoulder and is worse with reaching directly overhead or reaching out away from his body to pick up heavy objects. He is also complaining of some catching and popping in the shoulder. He is getting pain at night when sleeping on his left side. Pain will radiate into his humerus but he denies radiating pain into his forearm or his hand. No numbness or tingling. No neck pain. He denies any problems with his left shoulder in the past. He does admit to a previous right shoulder rotator cuff issue which resolved spontaneously. He is also a former baseball player but denies any significant shoulder trauma while playing baseball. His primary care physician recently placed him on oral prednisone and his symptoms did resolve while on this medication but began to return upon its completion. No prior shoulder surgeries. No imaging. He is right-hand dominant.  Past medical history reviewed Medications reviewed Allergies reviewed    Review of Systems As above    Objective:   Physical Exam  Well-developed, well-nourished. No acute distress. Awake alert and oriented 3. Vital signs reviewed  Left shoulder: Patient has full active and passive range of motion with a positive painful arc. No tenderness to palpation over the acromioclavicular joint nor overthe bicipital groove. Positive empty can, positive Hawkins. Rotator cuff strength is 5/5. No labral signs. Neurovascularly intact distally.      Assessment & Plan:   Left shoulder pain likely secondary to rotator cuff impingement/rotator cuff tendinitis  I discussed the merits of a  subacromial cortisone injection versus oral medication. Patient would like to try a course of prescription anti-inflammatories first. He has had success with naproxen sodium in the past so I'll put him on 500 mg of naproxen sodium twice daily with food for the next 10 days and then as needed. I've also given him a home Jobe exercise program and he will follow-up with me in 4 weeks for reevaluation. If symptoms persist, we will consider imaging. He will return to the office sooner if the naproxen sodium is ineffective to the point that he would like to reconsider a subacromial injection.

## 2016-10-05 ENCOUNTER — Encounter: Payer: Self-pay | Admitting: Sports Medicine

## 2016-10-05 ENCOUNTER — Ambulatory Visit (INDEPENDENT_AMBULATORY_CARE_PROVIDER_SITE_OTHER): Payer: Managed Care, Other (non HMO) | Admitting: Sports Medicine

## 2016-10-05 VITALS — BP 124/88 | Ht 74.0 in | Wt 238.0 lb

## 2016-10-05 DIAGNOSIS — M25512 Pain in left shoulder: Secondary | ICD-10-CM

## 2016-10-05 MED ORDER — METHYLPREDNISOLONE ACETATE 40 MG/ML IJ SUSP
40.0000 mg | Freq: Once | INTRAMUSCULAR | Status: AC
  Administered 2016-10-05: 40 mg via INTRA_ARTICULAR

## 2016-10-05 NOTE — Progress Notes (Signed)
   Subjective:    Patient ID: Bryan Mccall, male    DOB: [birth year], 51 y.o.   MRN: [id number]  HPI  Patient comes in today for follow-up on left shoulder pain. He was doing much better until he fell in the snow last week. Landed on an outstretched left hand. He reinjured his left shoulder. The naproxen sodium was helping. His exercises were also helping. His nighttime pain has resolved. He continues to deny weakness. He continues to localize his pain to the lateral aspect of the left shoulder.    Review of Systems As above    Objective:   Physical Exam  Well-developed, well-nourished. No acute distress  Left shoulder: Full range of motion with a positive painful arc. Positive empty can, positive Hawkins. Rotator cuff strength remains 5/5 but is reproducible of pain with resisted supraspinatus. No tenderness over the acromioclavicular joint nor over the bicipital groove. Neurovascularly intact distally.       Assessment & Plan:   Persistent left shoulder pain secondary to rotator cuff tendinopathy versus rotator cuff tear  I discussed treatment options including oral anti-inflammatories, cortisone injection, and MRI. Patient elected to try the cortisone injection. He will also resume his naproxen sodium for the next 7 days. Resume his exercises as his pain allows and follow-up with me in 3 weeks. If symptoms persist despite today's injection, then we will need to get imaging including x-ray and an MRI to rule out rotator cuff tear. Patient will call with questions or concerns in the interim.  Consent obtained and verified. Time-out conducted. Noted no overlying erythema, induration, or other signs of local infection. Skin prepped in a sterile fashion. Topical analgesic spray: Ethyl chloride. Joint: left shoulder (subacromial) Needle: 25g 1.5 inch Completed without difficulty. Meds: 1cc (40mg ) depomedrol, 3cc 1% xylocainr  Advised to call if fevers/chills, erythema,  induration, drainage, or persistent bleeding.

## 2016-10-26 ENCOUNTER — Ambulatory Visit: Payer: Managed Care, Other (non HMO) | Admitting: Sports Medicine

## 2016-11-12 ENCOUNTER — Ambulatory Visit (INDEPENDENT_AMBULATORY_CARE_PROVIDER_SITE_OTHER): Payer: Managed Care, Other (non HMO) | Admitting: Adult Health

## 2016-11-12 ENCOUNTER — Encounter: Payer: Self-pay | Admitting: Adult Health

## 2016-11-12 VITALS — BP 128/70 | Temp 98.5°F | Wt 237.4 lb

## 2016-11-12 DIAGNOSIS — R7309 Other abnormal glucose: Secondary | ICD-10-CM | POA: Diagnosis not present

## 2016-11-12 LAB — POCT GLYCOSYLATED HEMOGLOBIN (HGB A1C): Hemoglobin A1C: 6.1

## 2016-11-12 NOTE — Progress Notes (Signed)
Subjective:    Patient ID: Bryan Mccall, male    DOB: Jan 27, 1966, 51 y.o.   MRN: [id number]  HPI  51 year old male who  has a past medical history of Chicken pox; High cholesterol; Kidney stone; Seasonal allergies; and UTI (urinary tract infection). He presents today for three month follow up related to elevated blood sugar readings. During his physical in December it was found that his A1c was 6.5. He was asked to work on diet and exercise and we would repeat the A1c in three months.   Today in the office he reports that he has been working on his diet "significantly". He has reduced sugars and breads and has cut out fruits.   Per his scale at home he has lost 5 pounds   Review of Systems See HPI   Past Medical History:  Diagnosis Date  . Chicken pox   . High cholesterol   . Kidney stone   . Seasonal allergies   . UTI (urinary tract infection)     Social History   Social History  . Marital status: Married    Spouse name: N/A  . Number of children: N/A  . Years of education: N/A   Occupational History  . Not on file.   Social History Main Topics  . Smoking status: Never Smoker  . Smokeless tobacco: Never Used  . Alcohol use 0.0 oz/week     Comment: per pt 1 beer 3 to 4 nights a week with dinner   . Drug use: No  . Sexual activity: Not on file   Other Topics Concern  . 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  Family History  Problem Relation Age of Onset  . Arthritis Mother   . Arthritis Maternal Grandmother   . Elevated Lipids      both sides of family  . Hypertension Mother   . Hypertension Father   . Heart disease Father   . Emphysema Father   . Diabetes Father   . Hypertension Paternal Grandfather   . Heart disease Paternal Grandfather   . Hypertension Brother     No Known Allergies  Current Outpatient  Prescriptions on File Prior to Visit  Medication Sig Dispense Refill  . naproxen (NAPROSYN) 500 MG tablet Take one tab twice a day with food as needed 40 tablet 2  . simvastatin (ZOCOR) 40 MG tablet Take 1 tablet (40 mg total) by mouth daily. 90 tablet 3  . sildenafil (VIAGRA) 25 MG tablet Take 25 mg by mouth daily as needed for erectile dysfunction.     No current facility-administered medications on file prior to visit.     BP 128/70   Temp 98.5 F (36.9 C) (Oral)   Wt 237 lb 6.4 oz (107.7 kg)   BMI 30.48 kg/m       Objective:   Physical Exam  Constitutional: He is oriented to person, place, and time. He appears well-developed and well-nourished. No distress.  Cardiovascular: Normal rate, regular rhythm, normal heart sounds and intact distal pulses.  Exam reveals no gallop and no friction rub.   No murmur heard. Pulmonary/Chest: Effort normal and breath sounds normal. No respiratory distress. He has no wheezes. He has no rales. He exhibits no tenderness.  Neurological: He is alert and oriented to person,  place, and time.  Skin: Skin is warm and dry. No rash noted. He is not diaphoretic. No erythema. No pallor.  Psychiatric: He has a normal mood and affect. His behavior is normal. Judgment and thought content normal.  Nursing note and vitals reviewed.     Assessment & Plan:  1. Elevated glucose - POC HgB A1c- 6.1  - Has improved - Continue with weight loss and start exercising.  - Will repeat A1c at his physical   Shirline Frees, NP

## 2016-11-23 ENCOUNTER — Ambulatory Visit
Admission: RE | Admit: 2016-11-23 | Discharge: 2016-11-23 | Disposition: A | Discharge status: Home / Self Care | Payer: Managed Care, Other (non HMO) | Source: Ambulatory Visit | Attending: Sports Medicine | Admitting: Sports Medicine

## 2016-11-23 ENCOUNTER — Telehealth: Payer: Self-pay | Admitting: *Deleted

## 2016-11-23 DIAGNOSIS — M25512 Pain in left shoulder: Secondary | ICD-10-CM

## 2016-11-23 NOTE — Telephone Encounter (Signed)
Order placed and pt can go and get xrays at his convenience

## 2016-11-24 NOTE — Addendum Note (Signed)
Addended by: Annita BrodMOORE, [place] C on: 11/24/2016 10:11 AM   Modules accepted: Orders

## 2016-11-27 ENCOUNTER — Ambulatory Visit (INDEPENDENT_AMBULATORY_CARE_PROVIDER_SITE_OTHER): Payer: Managed Care, Other (non HMO) | Admitting: Sports Medicine

## 2016-11-27 ENCOUNTER — Encounter: Payer: Self-pay | Admitting: Sports Medicine

## 2016-11-27 ENCOUNTER — Ambulatory Visit
Admission: RE | Admit: 2016-11-27 | Discharge: 2016-11-27 | Disposition: A | Discharge status: Home / Self Care | Payer: Managed Care, Other (non HMO) | Source: Ambulatory Visit | Attending: Sports Medicine | Admitting: Sports Medicine

## 2016-11-27 VITALS — BP 125/78 | HR 71 | Ht 74.0 in | Wt 233.0 lb

## 2016-11-27 DIAGNOSIS — M25512 Pain in left shoulder: Secondary | ICD-10-CM | POA: Diagnosis not present

## 2016-11-27 MED ORDER — GABAPENTIN 300 MG PO CAPS: 300.0000 mg | ORAL_CAPSULE | Freq: Every day | ORAL | 1 refills | Status: AC

## 2016-11-27 MED ORDER — METHYLPREDNISOLONE ACETATE 40 MG/ML IJ SUSP
40.0000 mg | Freq: Once | INTRAMUSCULAR | Status: AC
  Administered 2016-11-27: 40 mg via INTRA_ARTICULAR

## 2016-11-27 NOTE — Progress Notes (Signed)
   Subjective:    Patient ID: Bryan Mccall, male    DOB: 1966/07/01, 51 y.o.   MRN: [id number]  HPI   Bryan Mccall comes in today with persistent left shoulder pain. Subacromial cortisone injection administered back in January provided him with significant symptom relief up until recently. Over the past 2 weeks he's had significant nighttime pain. Although most of his pain is localized to the lateral shoulder it does become more diffuse at night. He has pain both with sleeping on the left shoulder as well as with lying on his right shoulder. Some radiating pain down the arm to the elbow. He has started to notice some decreased range of motion. He is taking naproxen sodium on a daily basis without symptom relief. He denies numbness or tingling. No neck pain. Recent x-rays of his left shoulder showed acromioclavicular degenerative changes and some mild glenohumeral DJD. Nothing acute. He has an MRI scheduled for March 26.    Review of Systems    as above Objective:   Physical Exam  Well-developed, well-nourished. No acute distress.  Left shoulder: Patient has limited active and passive range of motion in both external rotation and abduction. The limitation in passive range of motion is minimal but definitely present. He does appear to have intact rotator cuff strength but the exam is limited somewhat by pain. He is not tender to palpation directly over the acromioclavicular joint. No tenderness in the bicipital groove. Neurovascularly intact distally.  X-rays of the left shoulder are as above      Assessment & Plan:   Left shoulder pain secondary to rotator cuff tendinopathy versus rotator cuff tear versus adhesive capsulitis versus glenohumeral DJD  His clinical picture is mixed. Although his previous injection was administered into the subacromial space I think his main pain generator may be the glenohumeral joint either from early adhesive capsulitis or glenohumeral DJD. Therefore, I  reinjected his shoulder today with cortisone but directed the needle towards the glenohumeral joint.He may continue with his naproxen sodium but I did caution him about GI upset. I would also like to try him on 300 mg of gabapentin daily at bedtime. He will start pendulum swings and wall walks and will proceed with his MRI on March 26 as scheduled. Phone follow-up after I review that study. We will delineate further treatment based on those findings as well as his response to today's treatment.  Consent obtained and verified. Time-out conducted. Noted no overlying erythema, induration, or other signs of local infection. Skin prepped in a sterile fashion. Topical analgesic spray: Ethyl chloride. Joint: left shoulder Needle: 25g 1.5 inch Completed without difficulty. Meds: 3cc 1% xylocaine, 1cc (40mg ) depomedrol  Advised to call if fevers/chills, erythema, induration, drainage, or persistent bleeding.

## 2016-12-01 ENCOUNTER — Telehealth: Payer: Self-pay | Admitting: Sports Medicine

## 2016-12-01 NOTE — Telephone Encounter (Signed)
  I spoke with Bryan Mccall on the phone today after reviewing the MRI of his left shoulder. He has a mildly displaced SLAP tear extending into the biceps anchor as well as synovitis in the rotator interval and adhesive capsulitis. He also has some rotator cuff tendinopathy. His pain has decreased after a recent intra-articular cortisone injection but he is still getting catching and occasional sharp pains in his shoulder. Therefore, I would like to refer the patient to Dr. Dion SaucierLandau to discuss further treatment options. Dr. Dion SaucierLandau may elect to continue with conservative treatment but his SLAP tear may eventually need to be surgically addressed. I'll defer further workup and treatment to the discretion of Dr. Dion SaucierLandau and the patient will follow-up with me as needed.

## 2016-12-01 NOTE — Telephone Encounter (Signed)
Dr [facility]ale [facility]Joshua Landau [facility] [address] [place] [address]  Monday, 12/07/16 at 1130am  [phone] [phone]

## 2016-12-07 ENCOUNTER — Other Ambulatory Visit: Payer: Managed Care, Other (non HMO)

## 2017-01-15 ENCOUNTER — Other Ambulatory Visit: Payer: Self-pay

## 2017-01-15 ENCOUNTER — Telehealth: Payer: Self-pay | Admitting: Adult Health

## 2017-01-15 DIAGNOSIS — E785 Hyperlipidemia, unspecified: Secondary | ICD-10-CM

## 2017-01-15 MED ORDER — SIMVASTATIN 40 MG PO TABS: 40.0000 mg | ORAL_TABLET | Freq: Every day | ORAL | 3 refills | Status: AC

## 2017-01-15 NOTE — Telephone Encounter (Signed)
° °  Pt request refill of the following:  Pt now uses [facility]  simvastatin (ZOCOR) 40 MG tablet    Phamacy:  [facility]

## 2017-01-15 NOTE — Telephone Encounter (Signed)
Rx has been refilled to preferred pharmacy.  

## 2017-04-03 IMAGING — DX DG FOOT COMPLETE 3+V*R*
3 series · 3 of 3 positions shown · non-contrast
Comparison: None.

CLINICAL DATA: Right heel pain for 1 month with no known injury

EXAM:
RIGHT FOOT COMPLETE - 3+ VIEW

[foot ap]
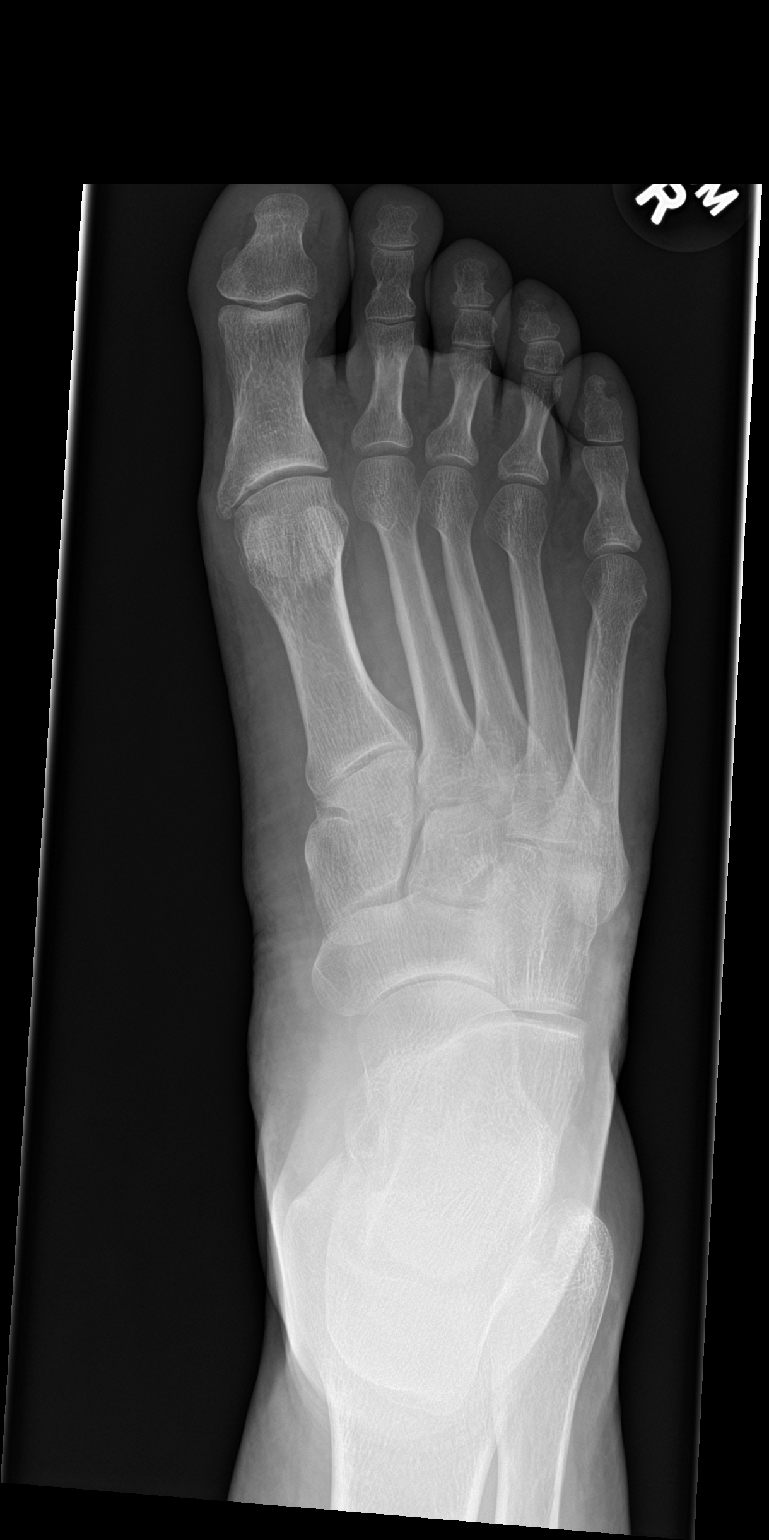

[foot obl]
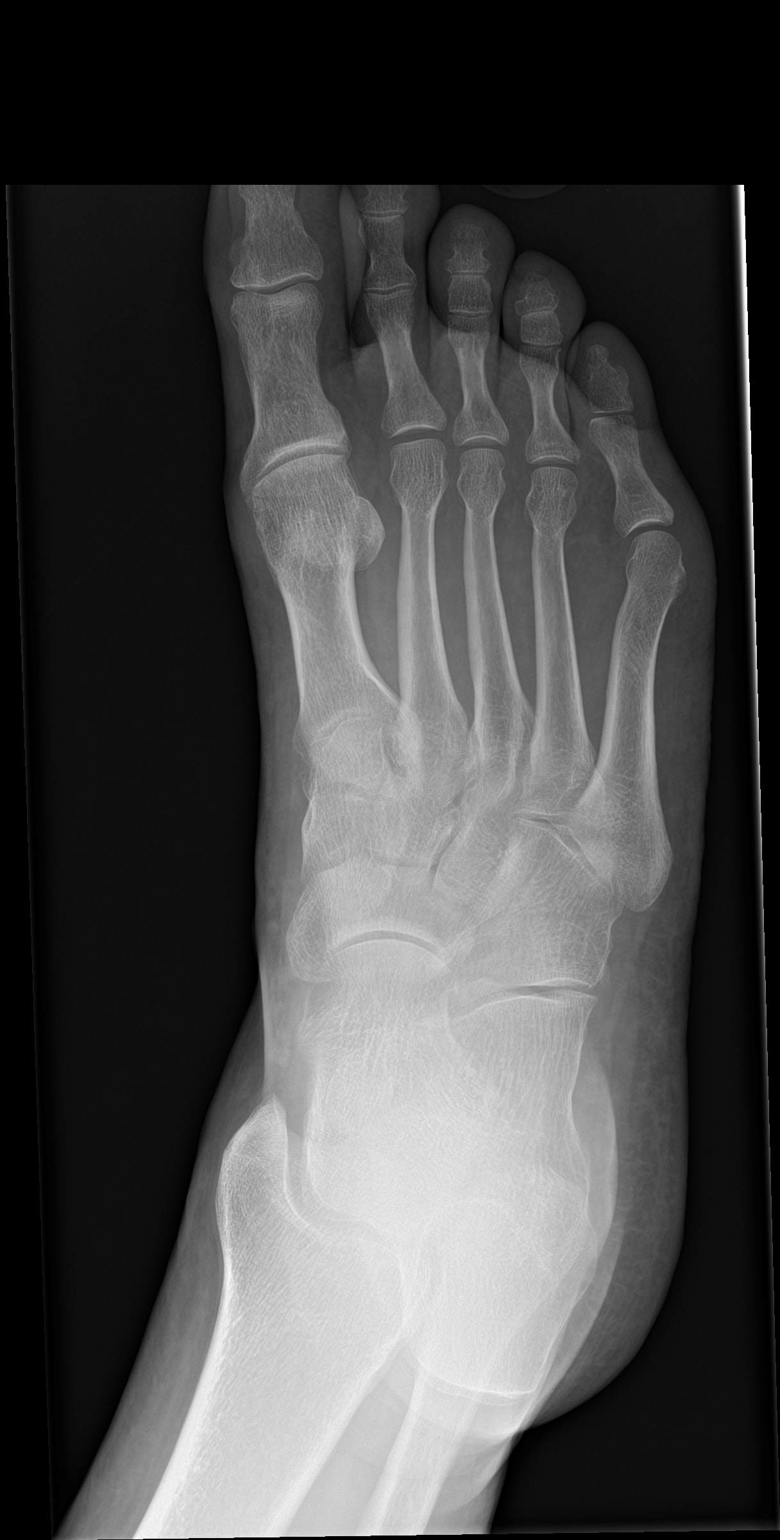

[foot lat]
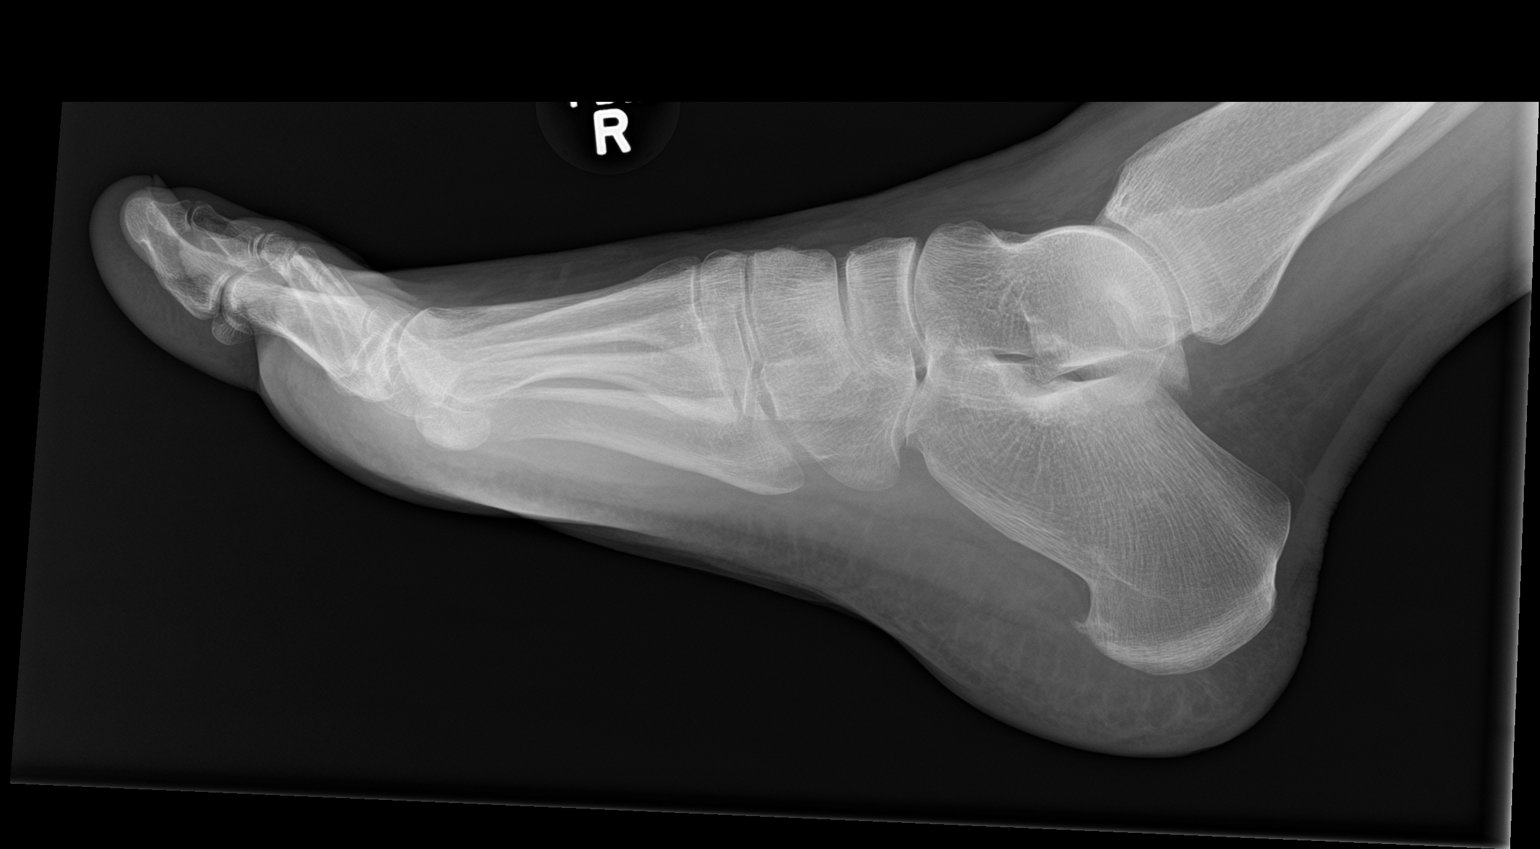

[3 of 3 positions shown; findings below may reference images not displayed]

FINDINGS: Tiny heel spur. No fracture or dislocation. No focal soft tissue
abnormalities.
IMPRESSION: No acute findings

## 2017-06-04 ENCOUNTER — Encounter: Payer: Self-pay | Admitting: Adult Health

## 2018-01-03 ENCOUNTER — Other Ambulatory Visit: Payer: Self-pay | Admitting: Adult Health

## 2018-01-03 DIAGNOSIS — E785 Hyperlipidemia, unspecified: Secondary | ICD-10-CM

## 2018-01-04 NOTE — Telephone Encounter (Signed)
Denied.  Pt not seen since 2017.  Will need physical and lab work.

## 2018-08-05 IMAGING — CR DG SHOULDER 2+V*L*
3 series · 3 of 3 positions shown · non-contrast
Comparison: None.

CLINICAL DATA: Left shoulder pain. No recent injury. Fall in
[REDACTED].

EXAM:
LEFT SHOULDER - 2+ VIEW

[w shoulder ap internal left]
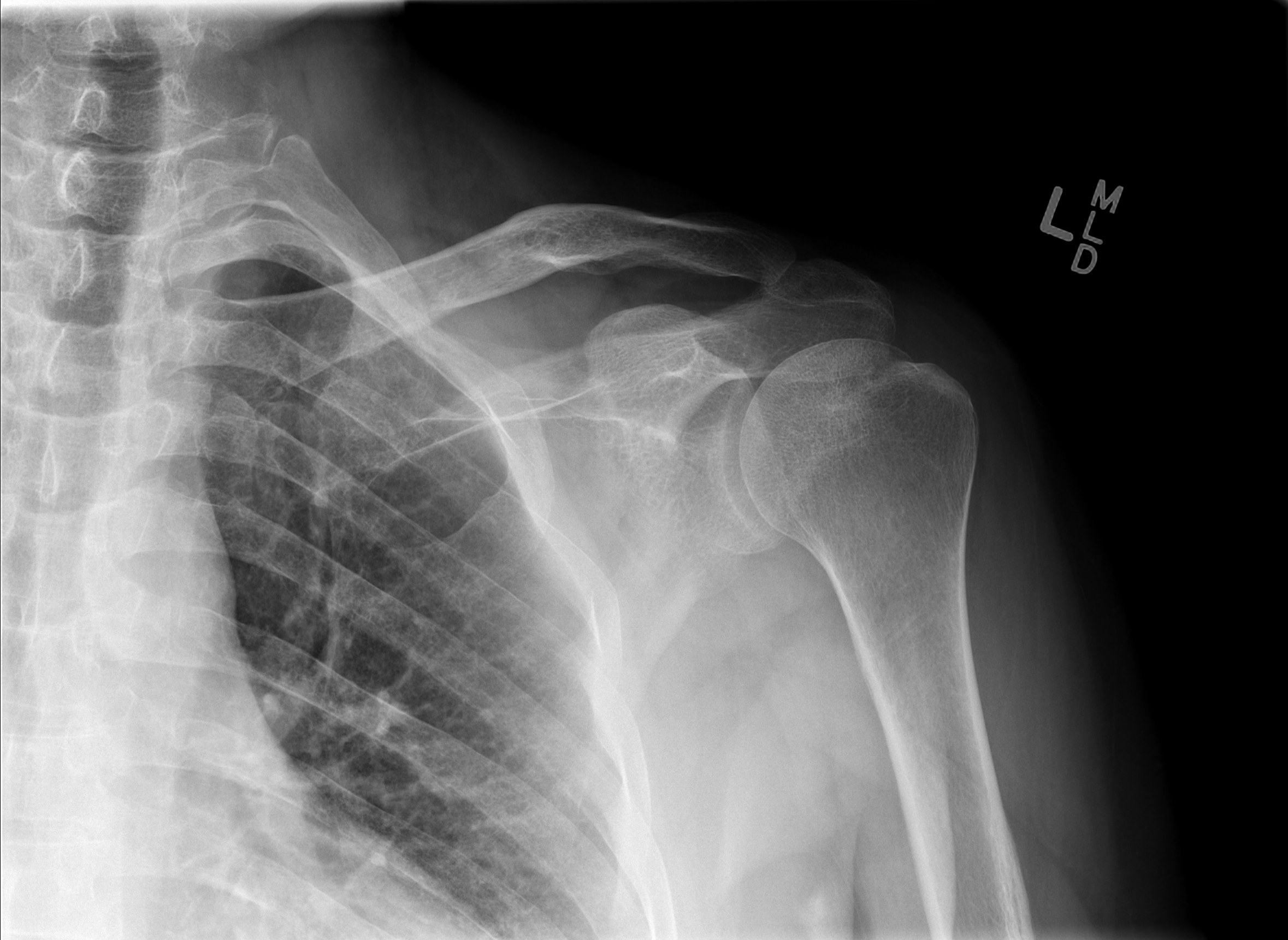

[w shoulder y view left]
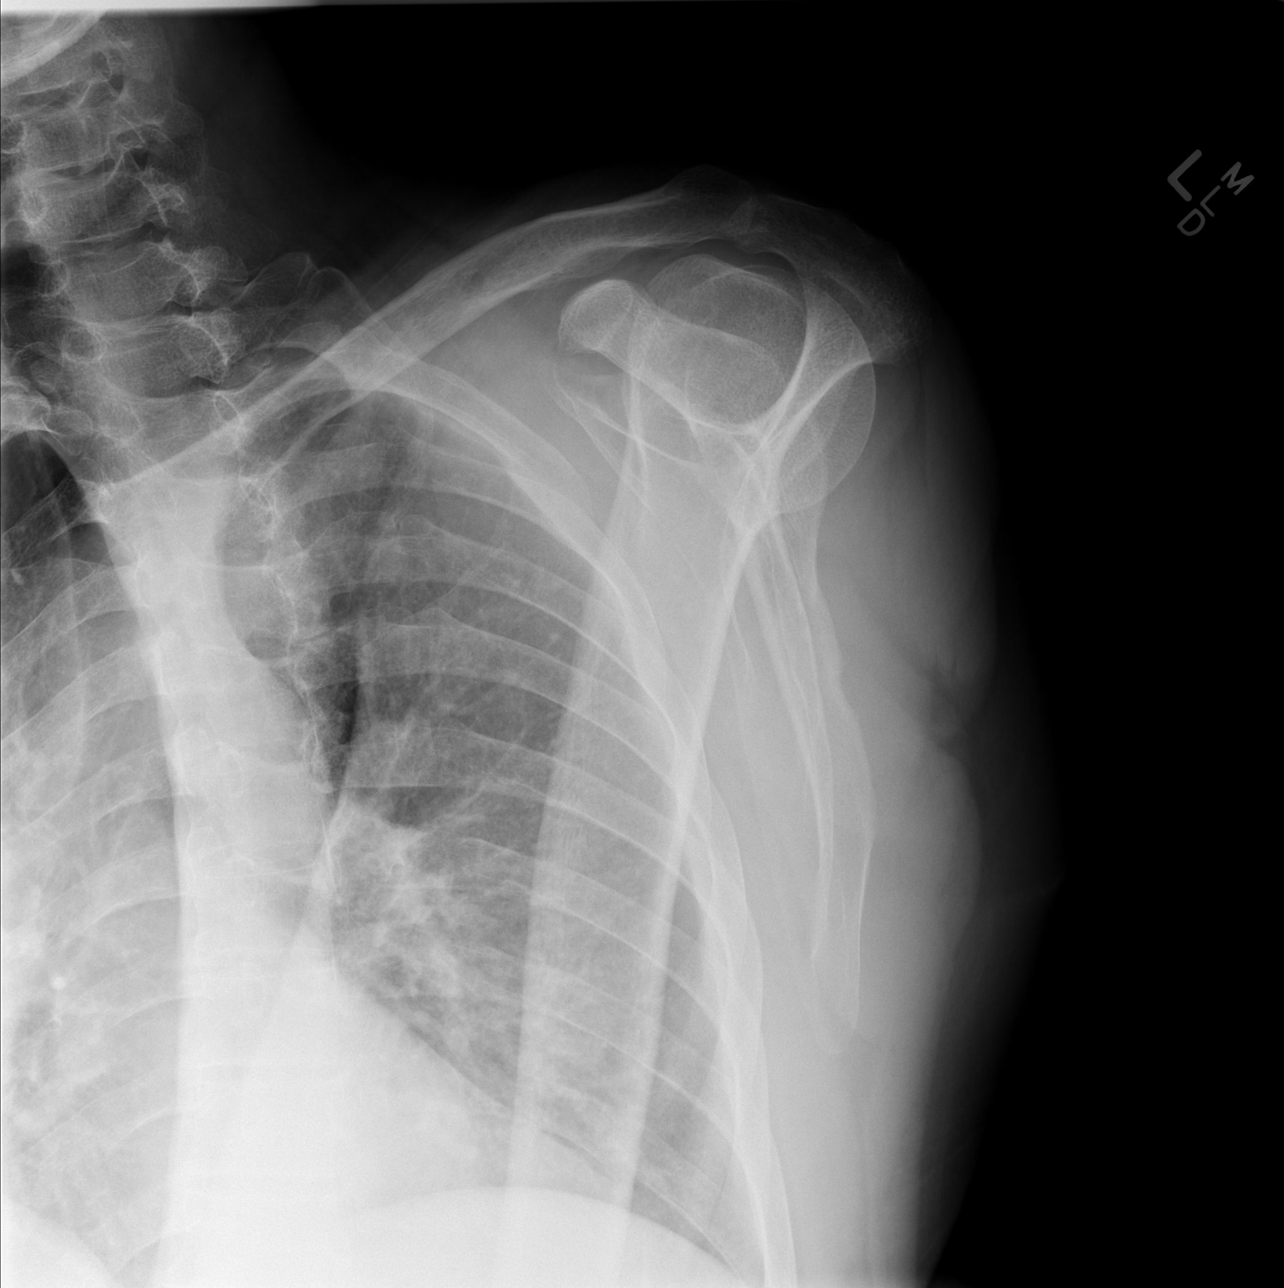

[x shoulder axillary left]
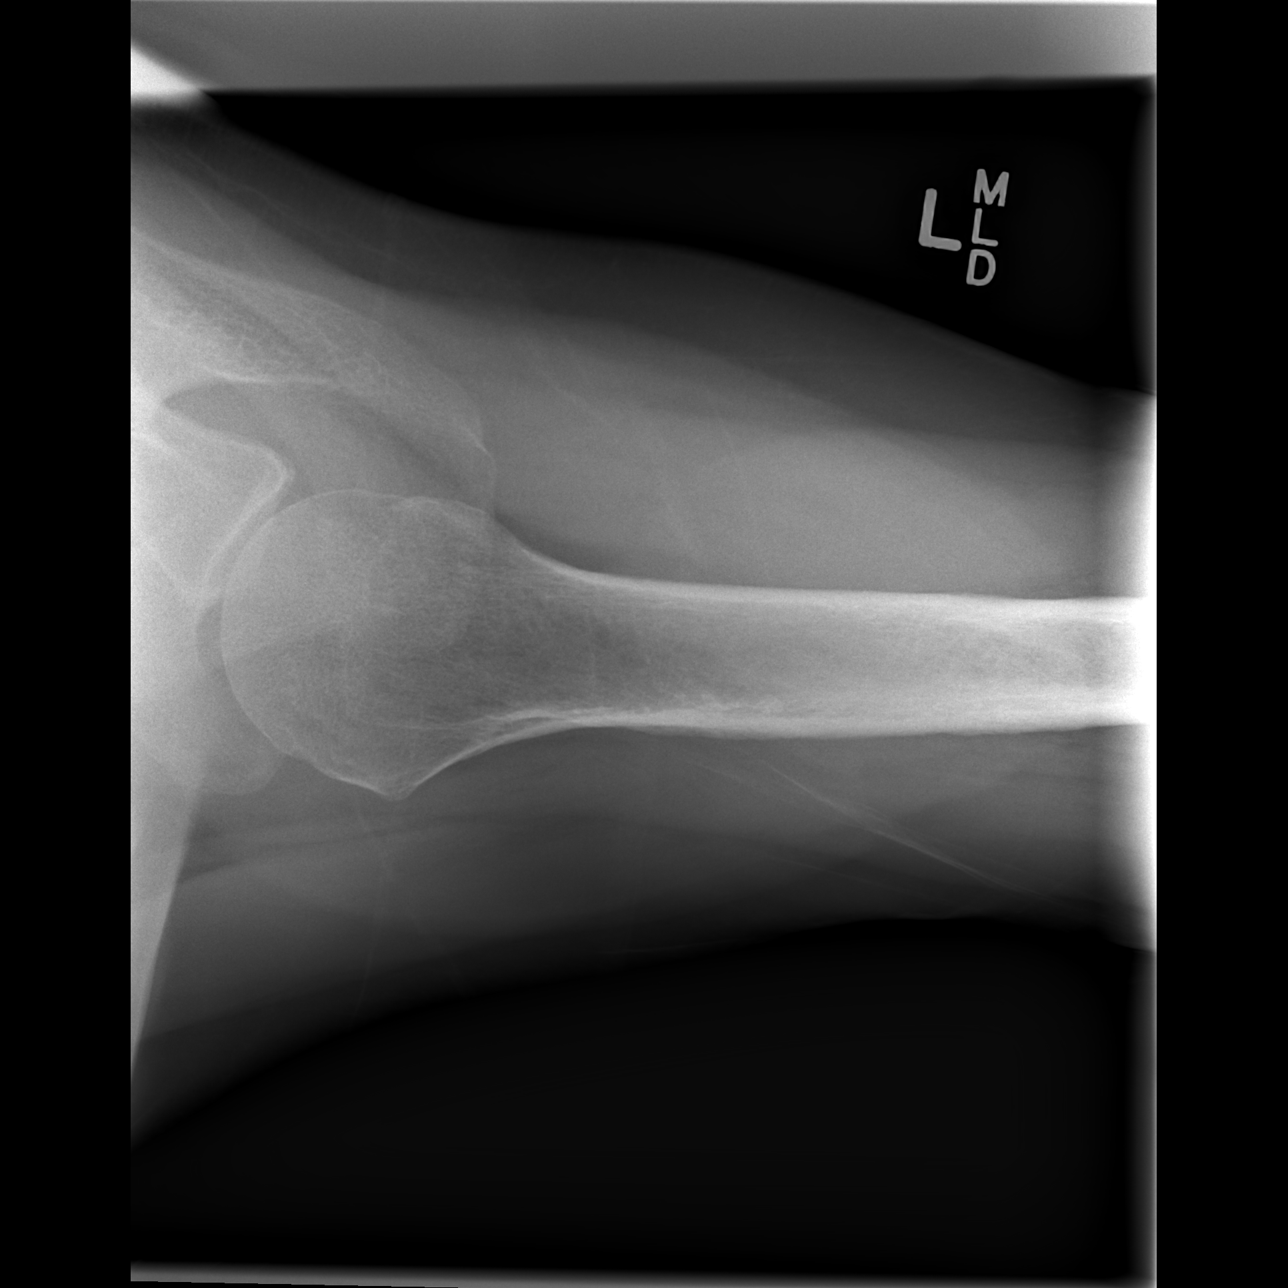

[3 of 3 positions shown; findings below may reference images not displayed]

FINDINGS: Acromioclavicular and glenohumeral degenerative change. No evidence
of fracture dislocation.
IMPRESSION: No acute abnormality identified.
